# Patient Record
Sex: Female | Born: 1995 | Race: White | Hispanic: No | Marital: Single | Smoking: Current every day smoker
Health system: Southern US, Community
[De-identification: ages and names within clinical notes are randomized; demographics above are authoritative.]

## PROBLEM LIST (undated history)

## (undated) DIAGNOSIS — R011 Cardiac murmur, unspecified: Secondary | ICD-10-CM

## (undated) DIAGNOSIS — R569 Unspecified convulsions: Secondary | ICD-10-CM

## (undated) DIAGNOSIS — F419 Anxiety disorder, unspecified: Secondary | ICD-10-CM

## (undated) DIAGNOSIS — E669 Obesity, unspecified: Secondary | ICD-10-CM

## (undated) HISTORY — DX: Anxiety disorder, unspecified: F41.9

## (undated) HISTORY — PX: TONSILLECTOMY: SUR1361

---
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
## 2017-01-02 ENCOUNTER — Encounter: Payer: Self-pay | Admitting: Emergency Medicine

## 2017-01-02 ENCOUNTER — Emergency Department
Admission: EM | Admit: 2017-01-02 | Discharge: 2017-01-02 | Disposition: A | Discharge status: Home / Self Care | Payer: Medicaid Other | Attending: Emergency Medicine | Admitting: Emergency Medicine

## 2017-01-02 ENCOUNTER — Emergency Department: Payer: Medicaid Other

## 2017-01-02 DIAGNOSIS — R69 Illness, unspecified: Secondary | ICD-10-CM

## 2017-01-02 DIAGNOSIS — R51 Headache: Secondary | ICD-10-CM | POA: Insufficient documentation

## 2017-01-02 DIAGNOSIS — Z7722 Contact with and (suspected) exposure to environmental tobacco smoke (acute) (chronic): Secondary | ICD-10-CM | POA: Insufficient documentation

## 2017-01-02 DIAGNOSIS — R05 Cough: Secondary | ICD-10-CM | POA: Insufficient documentation

## 2017-01-02 DIAGNOSIS — H9203 Otalgia, bilateral: Secondary | ICD-10-CM | POA: Insufficient documentation

## 2017-01-02 DIAGNOSIS — R0981 Nasal congestion: Secondary | ICD-10-CM | POA: Diagnosis not present

## 2017-01-02 DIAGNOSIS — R110 Nausea: Secondary | ICD-10-CM | POA: Insufficient documentation

## 2017-01-02 DIAGNOSIS — J111 Influenza due to unidentified influenza virus with other respiratory manifestations: Secondary | ICD-10-CM

## 2017-01-02 DIAGNOSIS — J029 Acute pharyngitis, unspecified: Secondary | ICD-10-CM | POA: Diagnosis present

## 2017-01-02 MED ORDER — ONDANSETRON HCL 4 MG/2ML IJ SOLN: 4.0000 mg | Freq: Once | INTRAMUSCULAR | Status: DC

## 2017-01-02 MED ORDER — ONDANSETRON HCL 4 MG PO TABS: 4.0000 mg | ORAL_TABLET | Freq: Three times a day (TID) | ORAL | 1 refills | Status: AC | PRN

## 2017-01-02 MED ORDER — ONDANSETRON 4 MG PO TBDP
4.0000 mg | ORAL_TABLET | Freq: Once | ORAL | Status: AC
  Administered 2017-01-02: 4 mg via ORAL

## 2017-01-02 MED ORDER — ONDANSETRON 4 MG PO TBDP
ORAL_TABLET | ORAL | Status: AC
Start: 2017-01-02 — End: 2017-01-02
  Administered 2017-01-02: 4 mg via ORAL
  Filled 2017-01-02: qty 1

## 2017-01-02 NOTE — ED Notes (Signed)
Pt. Going home with mother. 

## 2017-01-02 NOTE — ED Notes (Signed)
Pt returned from xray

## 2017-01-02 NOTE — ED Notes (Signed)
Patient transported to X-ray via wheelchair with Link SnufferEddie, radiology tech

## 2017-01-02 NOTE — ED Notes (Signed)
Pt needing to use the bathroom before triaged

## 2017-01-02 NOTE — ED Provider Notes (Signed)
[facility] Emergency Department Provider Note  ____________________________________________  Time seen: Approximately 10:06 PM  I have reviewed the triage vital signs and the nursing notes.   HISTORY  Chief Complaint Sore Throat    HPI Abigail Griffin is a 21 y.o. female presenting to the emergency department with headache, congestion, nonproductive cough, pharyngitis bilateral otalgia, myalgias and fatigue for the past 4 days. Patient denies fever but she has not taken her temperature. No chills. No major changes in bowel or bladder habits. No diarrhea or emesis. Patient denies associated chest pain, chest tightness, shortness of breath, nausea, vomiting or abdominal pain. No alleviating measures have been undertaken.   Past Medical History:  Diagnosis Date  . Anxiety   . Heart murmur    as a baby  . Obesity   . Seizures (HCC)    as a child    Patient Active Problem List   Diagnosis Date Noted  . Abdominal pain in female patient 11/23/2016  . Leukocytosis 06/02/2016    Past Surgical History:  Procedure Laterality Date  . TONSILLECTOMY    . TONSILLECTOMY AND ADENOIDECTOMY Bilateral 02/10/2015   Procedure: TONSILLECTOMY ;  Surgeon: Vernie MurdersPaul Juengel, MD;  Location: [facility] ORS;  Service: ENT;  Laterality: Bilateral;    Prior to Admission medications   Medication Sig Start Date End Date Taking? Authorizing Provider  albuterol (PROVENTIL HFA;VENTOLIN HFA) 108 (90 Base) MCG/ACT inhaler Inhale 2 puffs into the lungs every 6 (six) hours as needed for wheezing or shortness of breath. 09/07/16  Yes Willy Eddyobinson, Patrick, MD  promethazine (PHENERGAN) 12.5 MG tablet Take 1 tablet (12.5 mg total) by mouth every 6 (six) hours as needed for nausea or vomiting. 09/07/16  Yes Willy Eddyobinson, Patrick, MD  traMADol (ULTRAM) 50 MG tablet Take 1 tablet (50 mg total) by mouth every 12 (twelve) hours as needed. 12/28/16 01/02/17 Yes Pia MauWoods, Benjamin Casanas M, PA-C    Allergies Citalopram; Amoxicillin;  and Other  Family History  Problem Relation Age of Onset  . Crohn's disease Mother   . Diabetes Father     Social History Social History  Substance Use Topics  . Smoking status: Passive Smoke Exposure - Never Smoker  . Smokeless tobacco: Never Used  . Alcohol use No     Review of Systems  Constitutional: Patient has had fever.  Eyes: No visual changes. No discharge ENT: Patient has had congestion.  Cardiovascular: no chest pain. Respiratory: Patient has had non-productive cough.  No SOB. Gastrointestinal: Patient has had nausea.  Genitourinary: Negative for dysuria. No hematuria Musculoskeletal: Patient has had myalgias. Skin: Negative for rash, abrasions, lacerations, ecchymosis. Neurological: Negative for headaches, focal weakness or numbness.  ____________________________________________   PHYSICAL EXAM:  VITAL SIGNS: ED Triage Vitals  Enc Vitals Group     BP 01/02/17 2141 (!) 145/95     Pulse Rate 01/02/17 2141 (!) 115     Resp 01/02/17 2141 (!) 24     Temp 01/02/17 2141 98.2 F (36.8 C)     Temp Source 01/02/17 2141 Oral     SpO2 01/02/17 2141 97 %     Weight 01/02/17 2142 300 lb (136.1 kg)     Height 01/02/17 2142 5\' 5"  (1.651 m)     Head Circumference --      Peak Flow --      Pain Score 01/02/17 2141 9     Pain Loc --      Pain Edu? --      Excl. in GC? --  Constitutional: Alert and oriented. Patient is lying supine in bed.  Eyes: Conjunctivae are normal. PERRL. EOMI. Head: Atraumatic. ENT:      Ears: Tympanic membranes are injected bilaterally without evidence of effusion or purulent exudate. Bony landmarks are visualized bilaterally. No pain with palpation at the tragus.      Nose: Nasal turbinates are edematous and erythematous. Copious rhinorrhea visualized.      Mouth/Throat: Mucous membranes are moist. Posterior pharynx is mildly erythematous. No tonsillar hypertrophy or purulent exudate. Uvula is midline. Neck: Full range of motion. No  pain is elicited with flexion at the neck. Hematological/Lymphatic/Immunilogical: No cervical lymphadenopathy. Cardiovascular: Normal rate, regular rhythm. Normal S1 and S2.  Good peripheral circulation. Respiratory: Normal respiratory effort without tachypnea or retractions. Lungs CTAB. Good air entry to the bases with no decreased or absent breath sounds. Gastrointestinal: Bowel sounds 4 quadrants. Soft and nontender to palpation. No guarding or rigidity. No palpable masses. No distention. No CVA tenderness.  Skin:  Skin is warm, dry and intact. No rash noted. Psychiatric: Mood and affect are normal. Speech and behavior are normal. Patient exhibits appropriate insight and judgement.  ____________________________________________   LABS (all labs ordered are listed, but only abnormal results are displayed)  Labs Reviewed - No data to display ____________________________________________  EKG   ____________________________________________  RADIOLOGY Geraldo Pitter, personally viewed and evaluated these images (plain radiographs) as part of my medical decision making, as well as reviewing the written report by the radiologist.  DG chest: No consolidations or findings consistent with pneumonia  No results found.  ____________________________________________    PROCEDURES  Procedure(s) performed:    Procedures    Medications - No data to display   ____________________________________________   INITIAL IMPRESSION / ASSESSMENT AND PLAN / ED COURSE  Pertinent labs & imaging results that were available during my care of the patient were reviewed by me and considered in my medical decision making (see chart for details).  Review of the [place] CSRS was performed in accordance of the [facility] prior to dispensing any controlled drugs.    Assessment and plan: Influenza-like illness Patient presents to the emergency department with headache, congestion, nonproductive cough,  pharyngitis bilateral otalgia, myalgias and fatigue for the past 4 days. DG chest revealed no consolidations or findings consistent with pneumonia. Symptoms are consistent with an influenza-like illness. Patient declines Tamiflu at this time. Rest and hydration were encouraged. Patient was advised to use Tylenol as needed for fever. Patient was advised to follow-up with her primary care provider in one week. All patient questions were answered. ____________________________________________  FINAL CLINICAL IMPRESSION(S) / ED DIAGNOSES  Final diagnoses:  None      NEW MEDICATIONS STARTED DURING THIS VISIT:  New Prescriptions   No medications on file        This chart was dictated using voice recognition software/Dragon. Despite best efforts to proofread, errors can occur which can change the meaning. Any change was purely unintentional.    Orvil Feil, PA-C 01/03/17 1609    Myrna Blazer, MD 01/06/17 [phone]

## 2017-01-02 NOTE — ED Triage Notes (Signed)
Pt states that her throat is hurting and is congested

## 2017-01-06 ENCOUNTER — Ambulatory Visit: Payer: Medicaid Other | Admitting: Psychiatry

## 2017-01-13 ENCOUNTER — Ambulatory Visit
Admission: EM | Admit: 2017-01-13 | Discharge: 2017-01-13 | Disposition: A | Discharge status: Home / Self Care | Payer: Medicaid Other | Attending: Family Medicine | Admitting: Family Medicine

## 2017-01-13 DIAGNOSIS — R059 Cough, unspecified: Secondary | ICD-10-CM

## 2017-01-13 DIAGNOSIS — R05 Cough: Secondary | ICD-10-CM | POA: Diagnosis not present

## 2017-01-13 DIAGNOSIS — W57XXXA Bitten or stung by nonvenomous insect and other nonvenomous arthropods, initial encounter: Secondary | ICD-10-CM

## 2017-01-13 DIAGNOSIS — J0110 Acute frontal sinusitis, unspecified: Secondary | ICD-10-CM | POA: Diagnosis not present

## 2017-01-13 DIAGNOSIS — J069 Acute upper respiratory infection, unspecified: Secondary | ICD-10-CM

## 2017-01-13 DIAGNOSIS — J321 Chronic frontal sinusitis: Secondary | ICD-10-CM

## 2017-01-13 MED ORDER — AZITHROMYCIN 250 MG PO TABS: 250.0000 mg | ORAL_TABLET | Freq: Every day | ORAL | 0 refills | Status: AC

## 2017-01-13 MED ORDER — GUAIFENESIN-DM 100-10 MG/5ML PO SYRP: 5.0000 mL | ORAL_SOLUTION | ORAL | 0 refills | Status: DC | PRN

## 2017-01-13 NOTE — Discharge Instructions (Signed)
-  azithromycin: Take 2 tablets by mouth the first day followed by one tablet daily for next 4 days. -Robitussin DM as needed for cough -can continue albuterol as needed for wheezing -drink plenty of fluids -continue Flonase with spray directed slightly towards ears -can use OTC nasal saline spray to help clear sinus congestion

## 2017-01-13 NOTE — ED Provider Notes (Signed)
CSN: [id number]     Arrival date & time 01/13/17  [id number] History   First MD Initiated Contact with Patient 01/13/17 1902     Chief Complaint  Patient presents with  . Nasal Congestion  . Insect Bite   (Consider location/radiation/quality/duration/timing/severity/associated sxs/prior Treatment) Patient is an 21 year old female who presents with complaint of sore throat, stuffy nose, and cold-like symptoms for approximately 3 weeks. Patient was seen in the ER at [facility] on [facility] and diagnosed with flulike illness and given prescription for Phenergan for nausea and guaifenesin with dextromethorphan for cough. Patient was seen by primary care 2 days later for same symptoms. She was diagnosed with bronchospasms and given a prescription for prednisone 40 mg daily 7 days. Patient sees completed the prednisone but still continues to have a cough. She does report some drainage in her throat which does cause her cough as well as her sore throat. Patient reports coughing up yellow sputum. Patient denies fever or chills but does report some headaches. She is using Flonase nasal spray to help with her symptoms. Patient also reporting some ear issues as well. Patient denies shortness of breath or chest pain. She denies smoking and alcohol use as well.  Patient also complains of some insect bites the back of her legs that she has had for about one week. Patient is using Benadryl to help with her symptoms of itching.      Past Medical History:  Diagnosis Date  . Anxiety   . Heart murmur    as a baby  . Obesity   . Seizures (HCC)    as a child   Past Surgical History:  Procedure Laterality Date  . TONSILLECTOMY    . TONSILLECTOMY AND ADENOIDECTOMY Bilateral 02/10/2015   Procedure: TONSILLECTOMY ;  Surgeon: Vernie MurdersPaul Juengel, MD;  Location: [facility] ORS;  Service: ENT;  Laterality: Bilateral;   Family History  Problem Relation Age of Onset  . Crohn's disease Mother   . Diabetes Father    Social  History  Substance Use Topics  . Smoking status: Passive Smoke Exposure - Never Smoker  . Smokeless tobacco: Never Used  . Alcohol use No   OB History    No data available     Review of Systems  Constitutional: Negative for chills and fever.  HENT: Positive for ear pain, postnasal drip, sinus pain, sinus pressure and sore throat.   Eyes: Negative.   Respiratory: Positive for cough (with production of yellow mucus). Negative for chest tightness and wheezing.   Gastrointestinal: Negative.   Genitourinary: Positive for urgency.  Musculoskeletal: Negative.   Skin:       Insect bites the back of her legs, itching  Neurological: Negative.     Allergies  Citalopram; Amoxicillin; and Other  Home Medications   Prior to Admission medications   Medication Sig Start Date End Date Taking? Authorizing Provider  albuterol (PROVENTIL HFA;VENTOLIN HFA) 108 (90 Base) MCG/ACT inhaler Inhale 2 puffs into the lungs every 6 (six) hours as needed for wheezing or shortness of breath. 09/07/16  Yes Willy Eddyobinson, Patrick, MD  promethazine (PHENERGAN) 12.5 MG tablet Take 1 tablet (12.5 mg total) by mouth every 6 (six) hours as needed for nausea or vomiting. 09/07/16  Yes Willy Eddyobinson, Patrick, MD  azithromycin (ZITHROMAX Z-PAK) 250 MG tablet Take 1 tablet (250 mg total) by mouth daily. Take 2 tablets by mouth the first day followed by one tablet daily for next 4 days. 01/13/17 01/18/17  Candis SchatzHarris, Clarice Bonaventure D, PA-C  guaiFENesin-dextromethorphan (  ROBITUSSIN DM) 100-10 MG/5ML syrup Take 5 mLs by mouth every 4 (four) hours as needed for cough. 01/13/17   Candis Schatz, PA-C   Meds Ordered and Administered this Visit  Medications - No data to display  BP 109/88 (BP Location: Left Arm)   Pulse (!) 106   Temp 98.3 F (36.8 C) (Oral)   Resp 18   Ht 5\' 3"  (1.6 m)   Wt 300 lb (136.1 kg)   SpO2 99%   BMI 53.14 kg/m  No data found.   Physical Exam  Constitutional: She is oriented to person, place, and time. She  appears well-developed. No distress.  HENT:  Head: Normocephalic and atraumatic.  Right Ear: Tympanic membrane and ear canal normal. No middle ear effusion.  Left Ear: Ear canal normal. Tympanic membrane is injected.  No middle ear effusion.  Nose: Right sinus exhibits frontal sinus tenderness. Right sinus exhibits no maxillary sinus tenderness. Left sinus exhibits frontal sinus tenderness. Left sinus exhibits no maxillary sinus tenderness.  Mouth/Throat: Posterior oropharyngeal erythema present.  Clear post nasal drainage  Cardiovascular: Normal rate, regular rhythm and normal heart sounds.   Pulmonary/Chest: Effort normal and breath sounds normal. She has no wheezes. She has no rales.  Abdominal: Soft.  Musculoskeletal: Normal range of motion.  Neurological: She is alert and oriented to person, place, and time.  Skin: Skin is warm and dry.  Several insect bites to the back or legs, flat. No evidence of infection or drainage.    Urgent Care Course     Procedures   Labs Review Labs Reviewed - No data to display  Imaging Review No results found.   MDM   1. Upper respiratory tract infection, unspecified type   2. Frontal sinusitis, unspecified chronicity   3. Cough    New Prescriptions   AZITHROMYCIN (ZITHROMAX Z-PAK) 250 MG TABLET    Take 1 tablet (250 mg total) by mouth daily. Take 2 tablets by mouth the first day followed by one tablet daily for next 4 days.   GUAIFENESIN-DEXTROMETHORPHAN (ROBITUSSIN DM) 100-10 MG/5ML SYRUP    Take 5 mLs by mouth every 4 (four) hours as needed for cough.   Patient continues to have cough and upper respiratory symptoms refractory to visits to the emergency room and to her primary care provider. Patient completed a seven-day course of high-dose prednisone with no improvement in her cough however she does not have any wheezing at this point. Patient with sinus pressure and pain with palpation along with cervical node tenderness. Give patient a  prescription for a Z-Pak as well as Robitussin-DM for her cough. Patient is to follow with her PCP or the clinic should her symptoms worsen or not improve. Patient advised she could use over-the-counter hydrocortisone cream and/or topical Benadryl cream/gel for her insect bite itching. Patient vocalized understanding is in agreement with this plan.  Candis Schatz, PA-C      Candis Schatz, PA-C 01/13/17 Barry Brunner

## 2017-01-13 NOTE — ED Triage Notes (Signed)
Patient complains of cough, congestion x 3 weeks. Patient states that she recently finished Prednisone. Patient states that she had got some better but has worsened again. Patient reports throat pain. Patient also reports bite marks on her legs that she attributes to mosquitos.

## 2017-01-26 ENCOUNTER — Ambulatory Visit: Payer: Medicaid Other | Admitting: Licensed Clinical Social Worker

## 2017-01-26 ENCOUNTER — Ambulatory Visit: Payer: Medicaid Other | Admitting: Psychiatry

## 2018-03-18 IMAGING — CR DG CHEST 2V
1 series · 2 of 2 positions shown · non-contrast
Comparison: Chest radiograph April 22, 2014

CLINICAL DATA: Productive cough, low-grade fever and chills.

EXAM:
CHEST  2 VIEW

[Series 1: dg chest 2 view · 0.14mm/px · 2 of 2 slices shown]
[im 1/2]
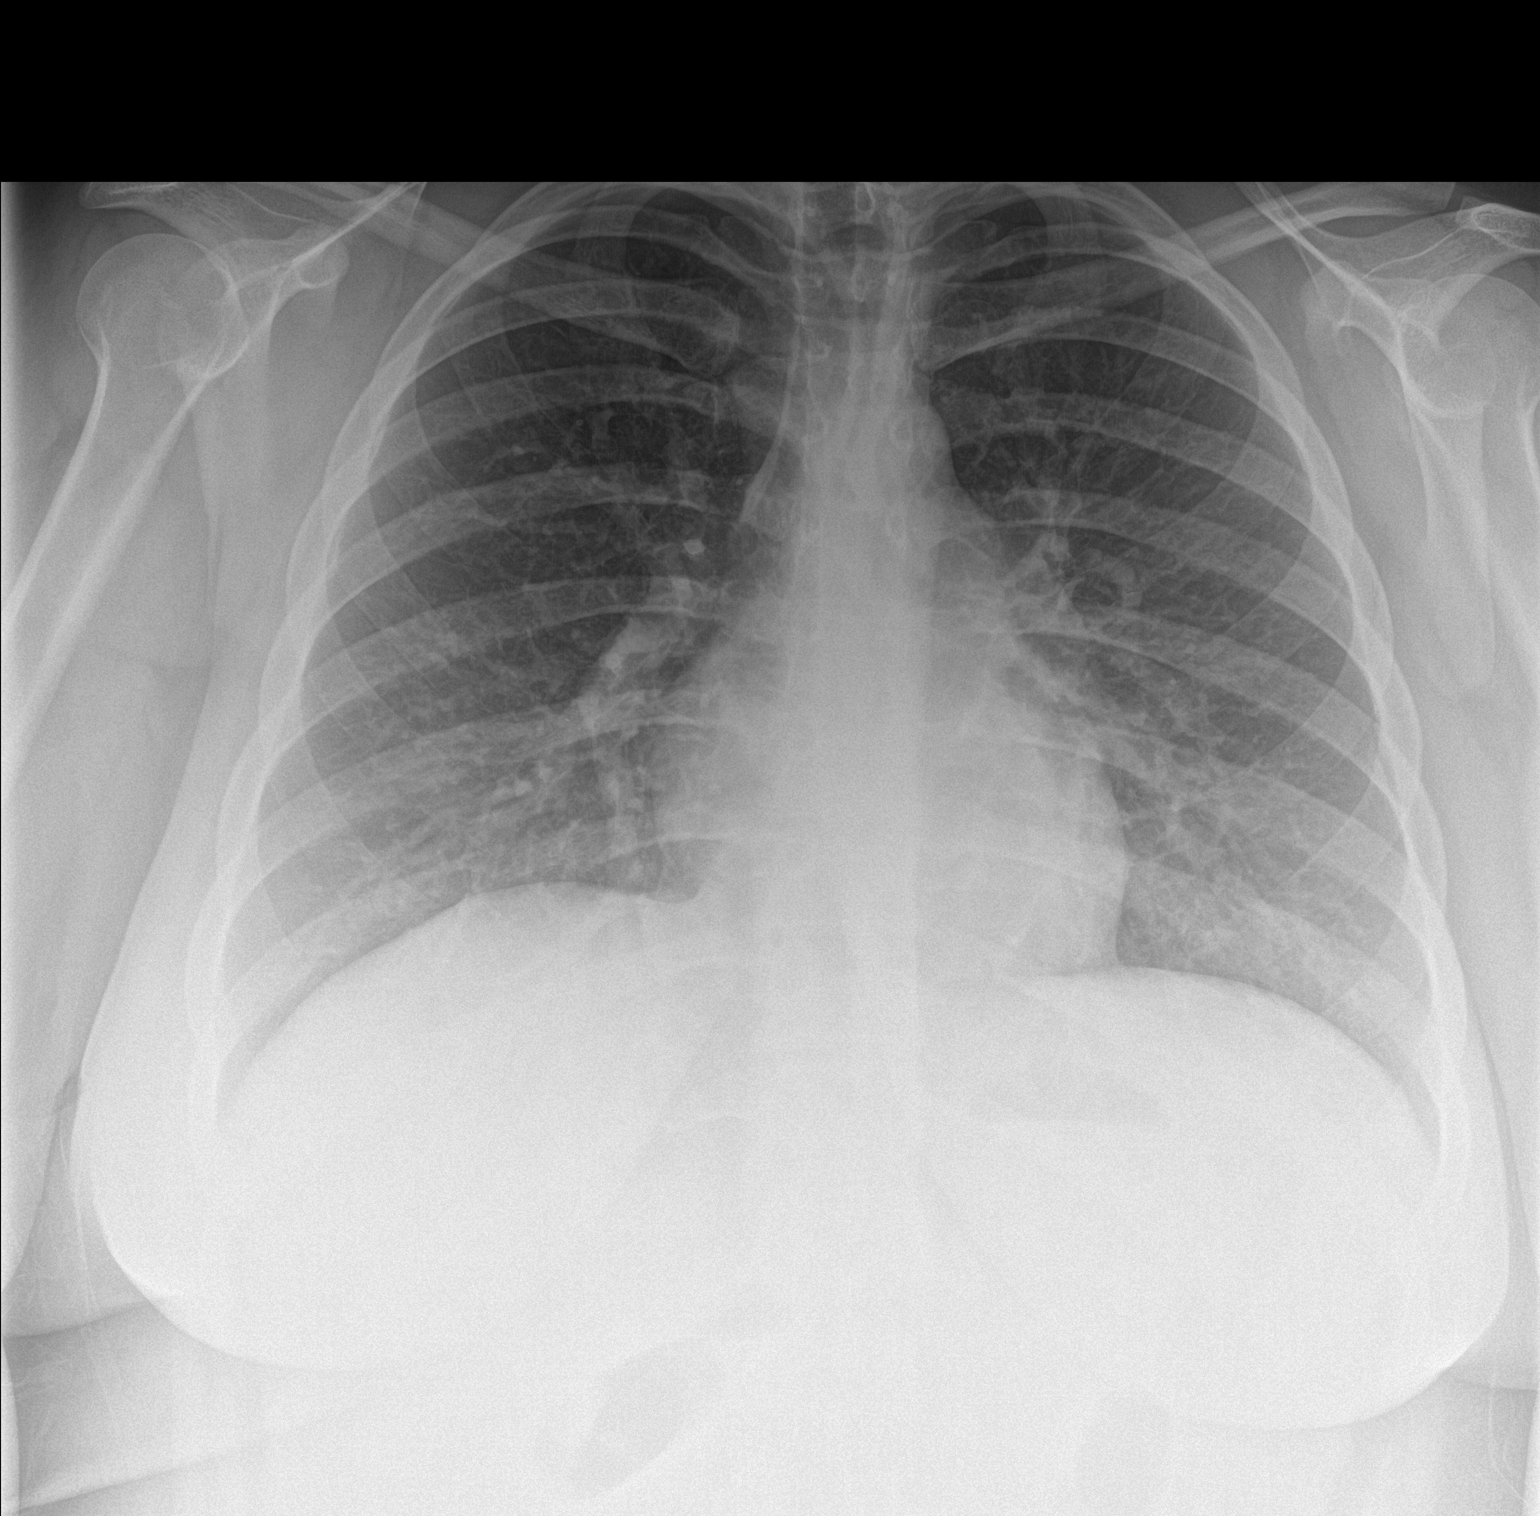
[im 2/2]
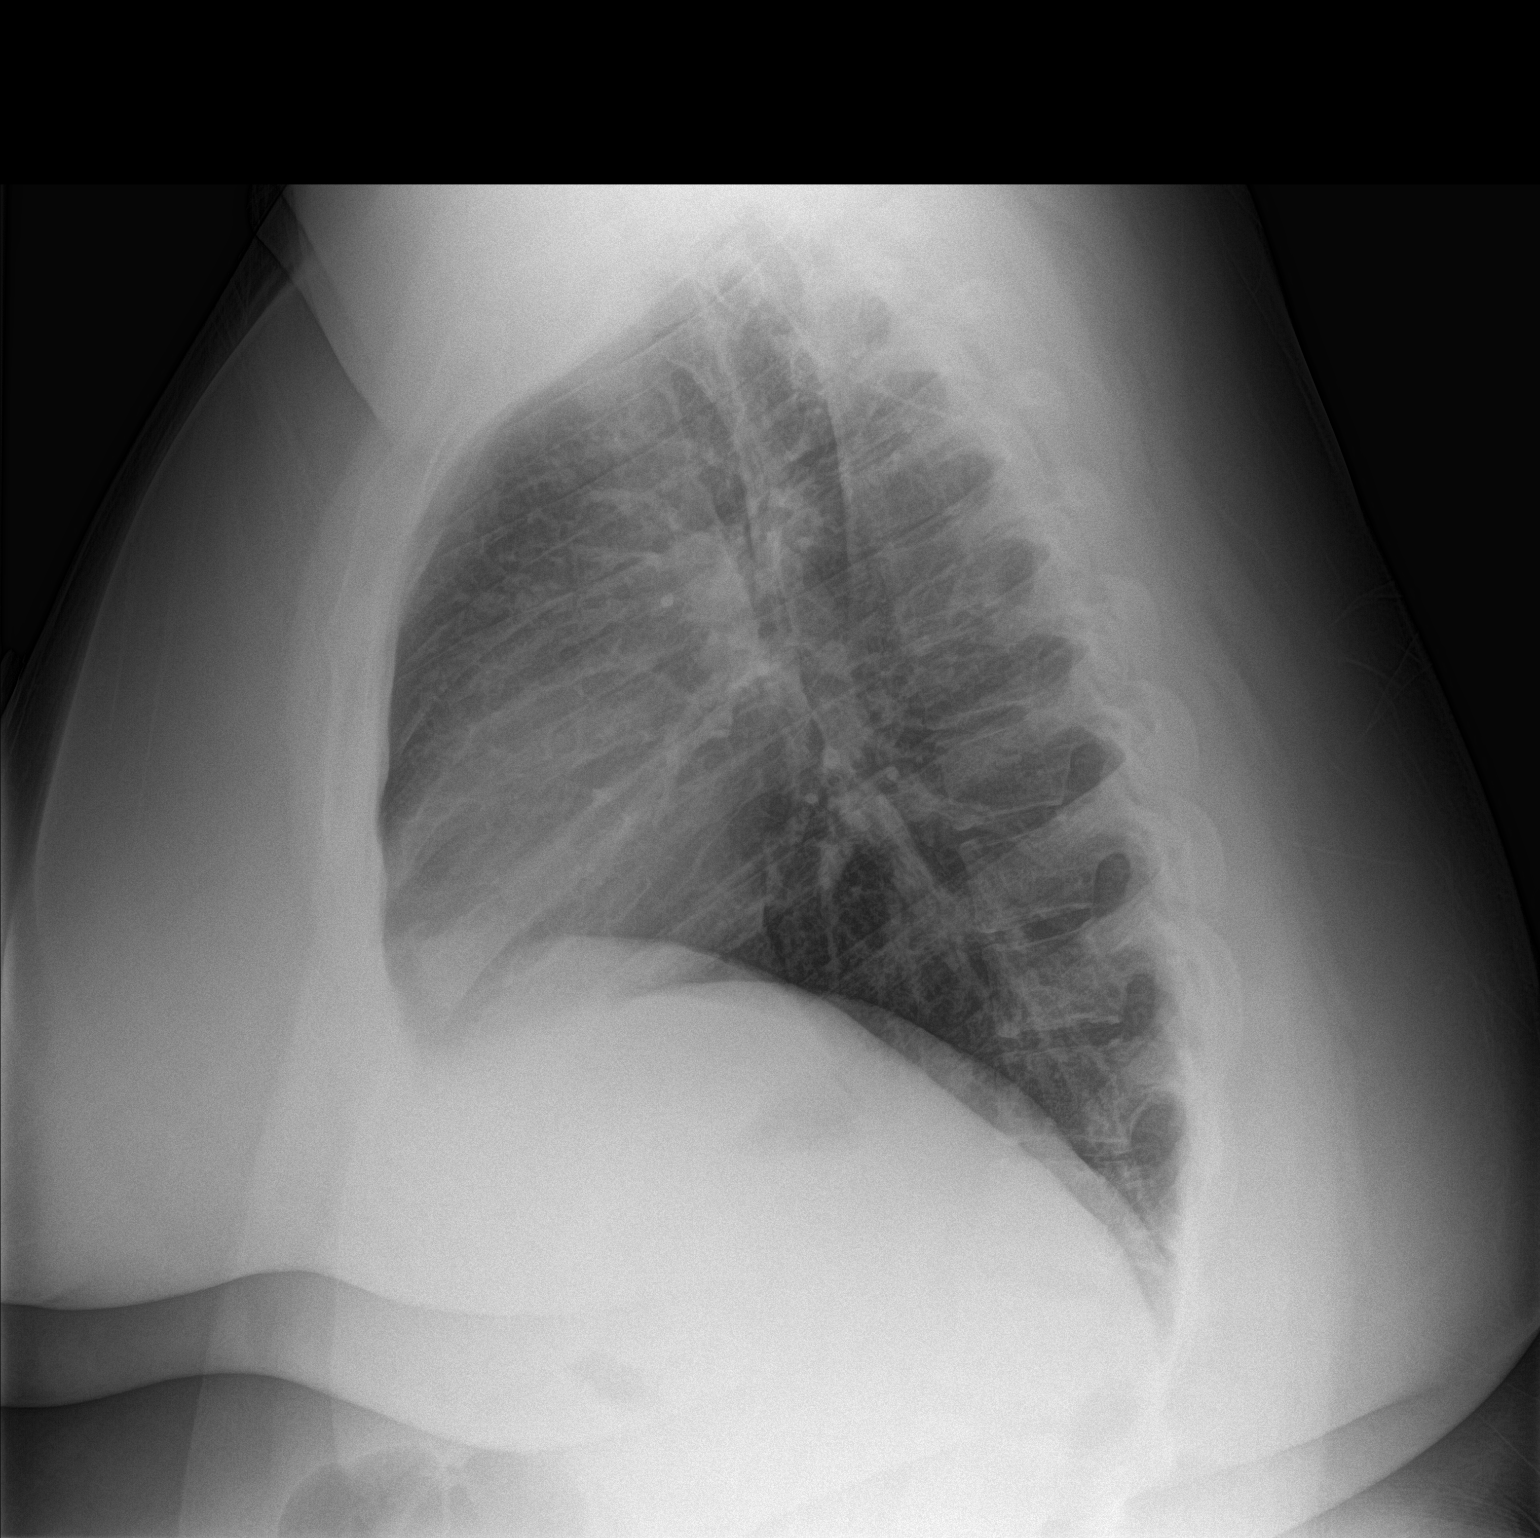

[2 of 2 positions shown; findings below may reference images not displayed]

FINDINGS: Cardiomediastinal silhouette is normal. No pleural effusions or
focal consolidations. Trachea projects midline and there is no
pneumothorax. Soft tissue planes and included osseous structures are
non-suspicious. Large body habitus.
IMPRESSION: Normal chest.

## 2018-03-20 IMAGING — CR DG CHEST 2V
2 series · 2 of 2 positions shown · non-contrast
Comparison: 08/25/2016 and earlier.

CLINICAL DATA: 20-year-old female with a dark brown vomiting for 5
days. Cough. Initial encounter.

EXAM:
CHEST  2 VIEW

[chest pa]
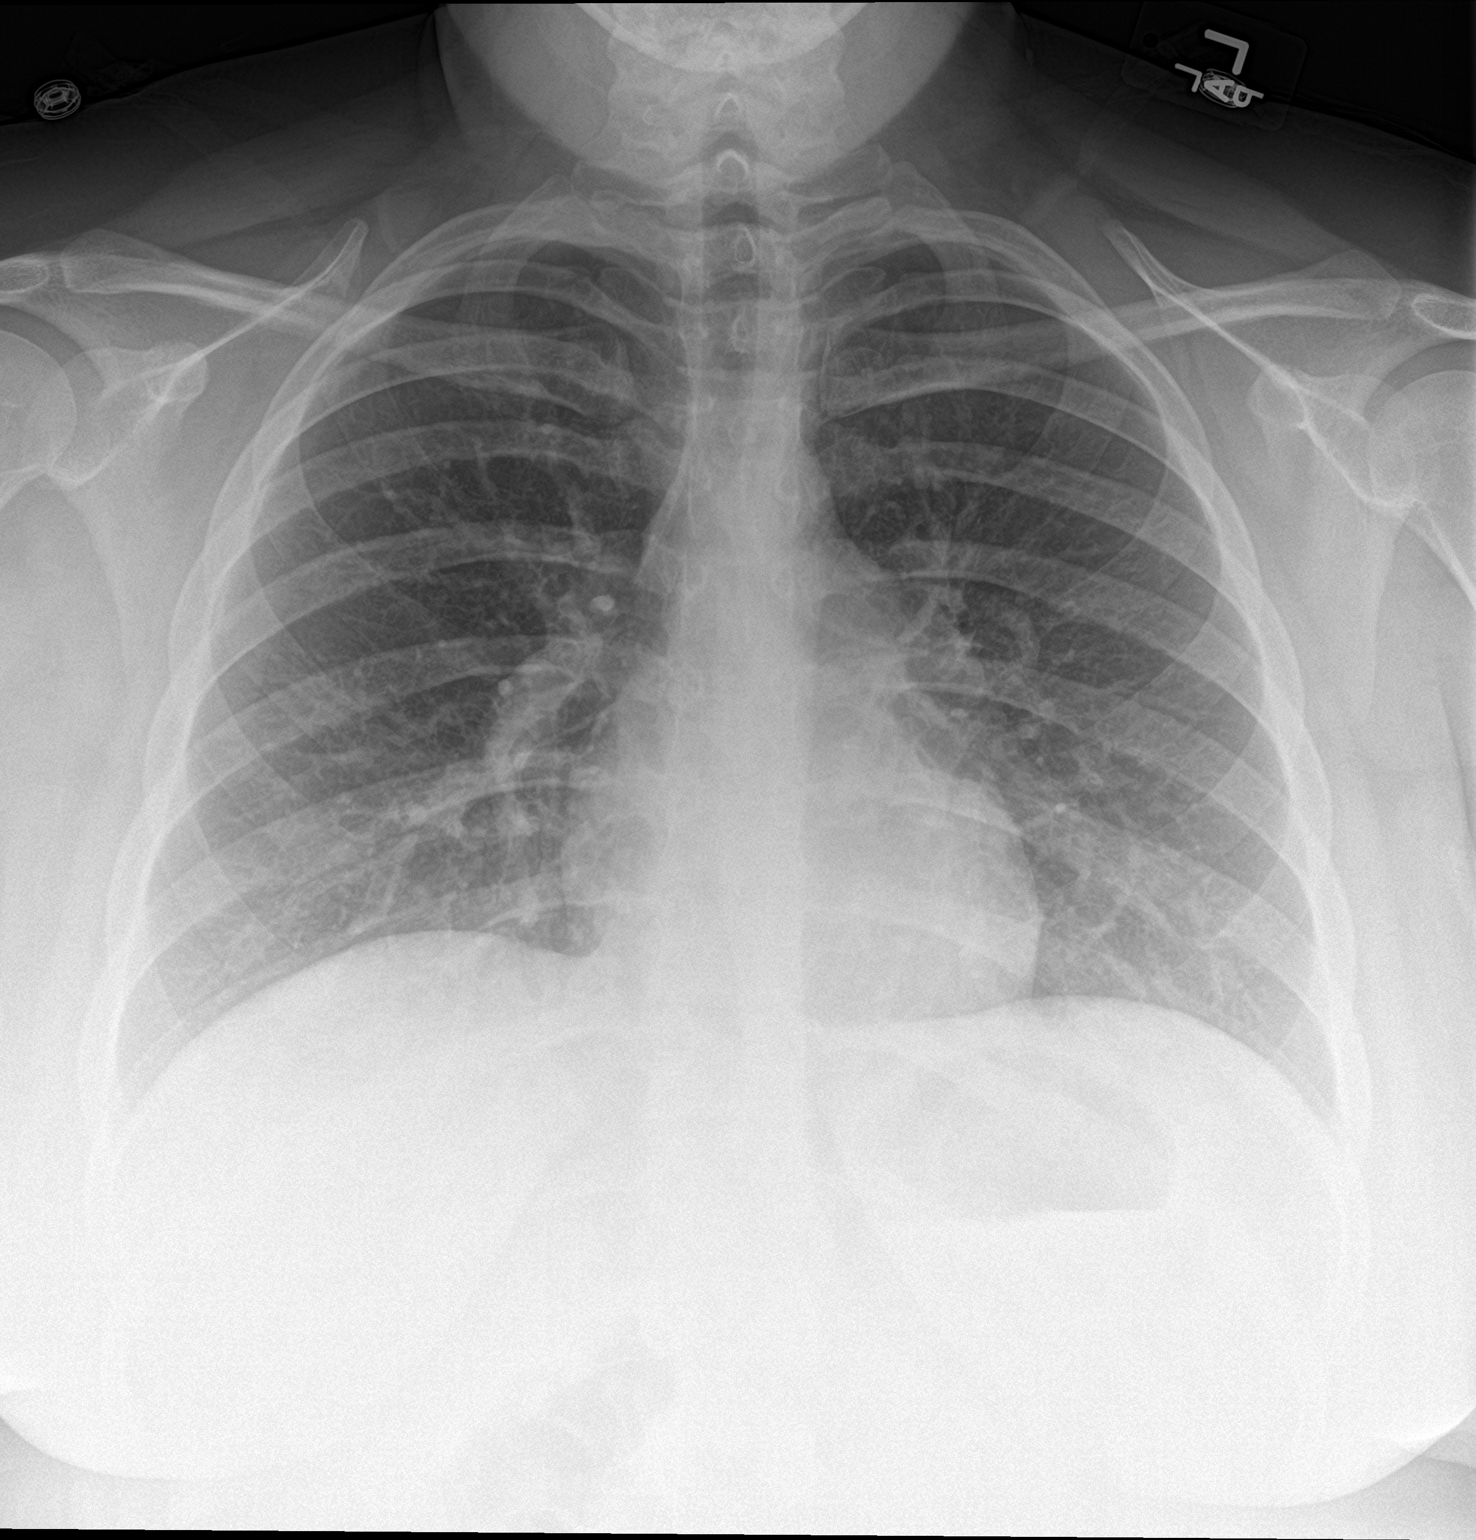

[chest lat]
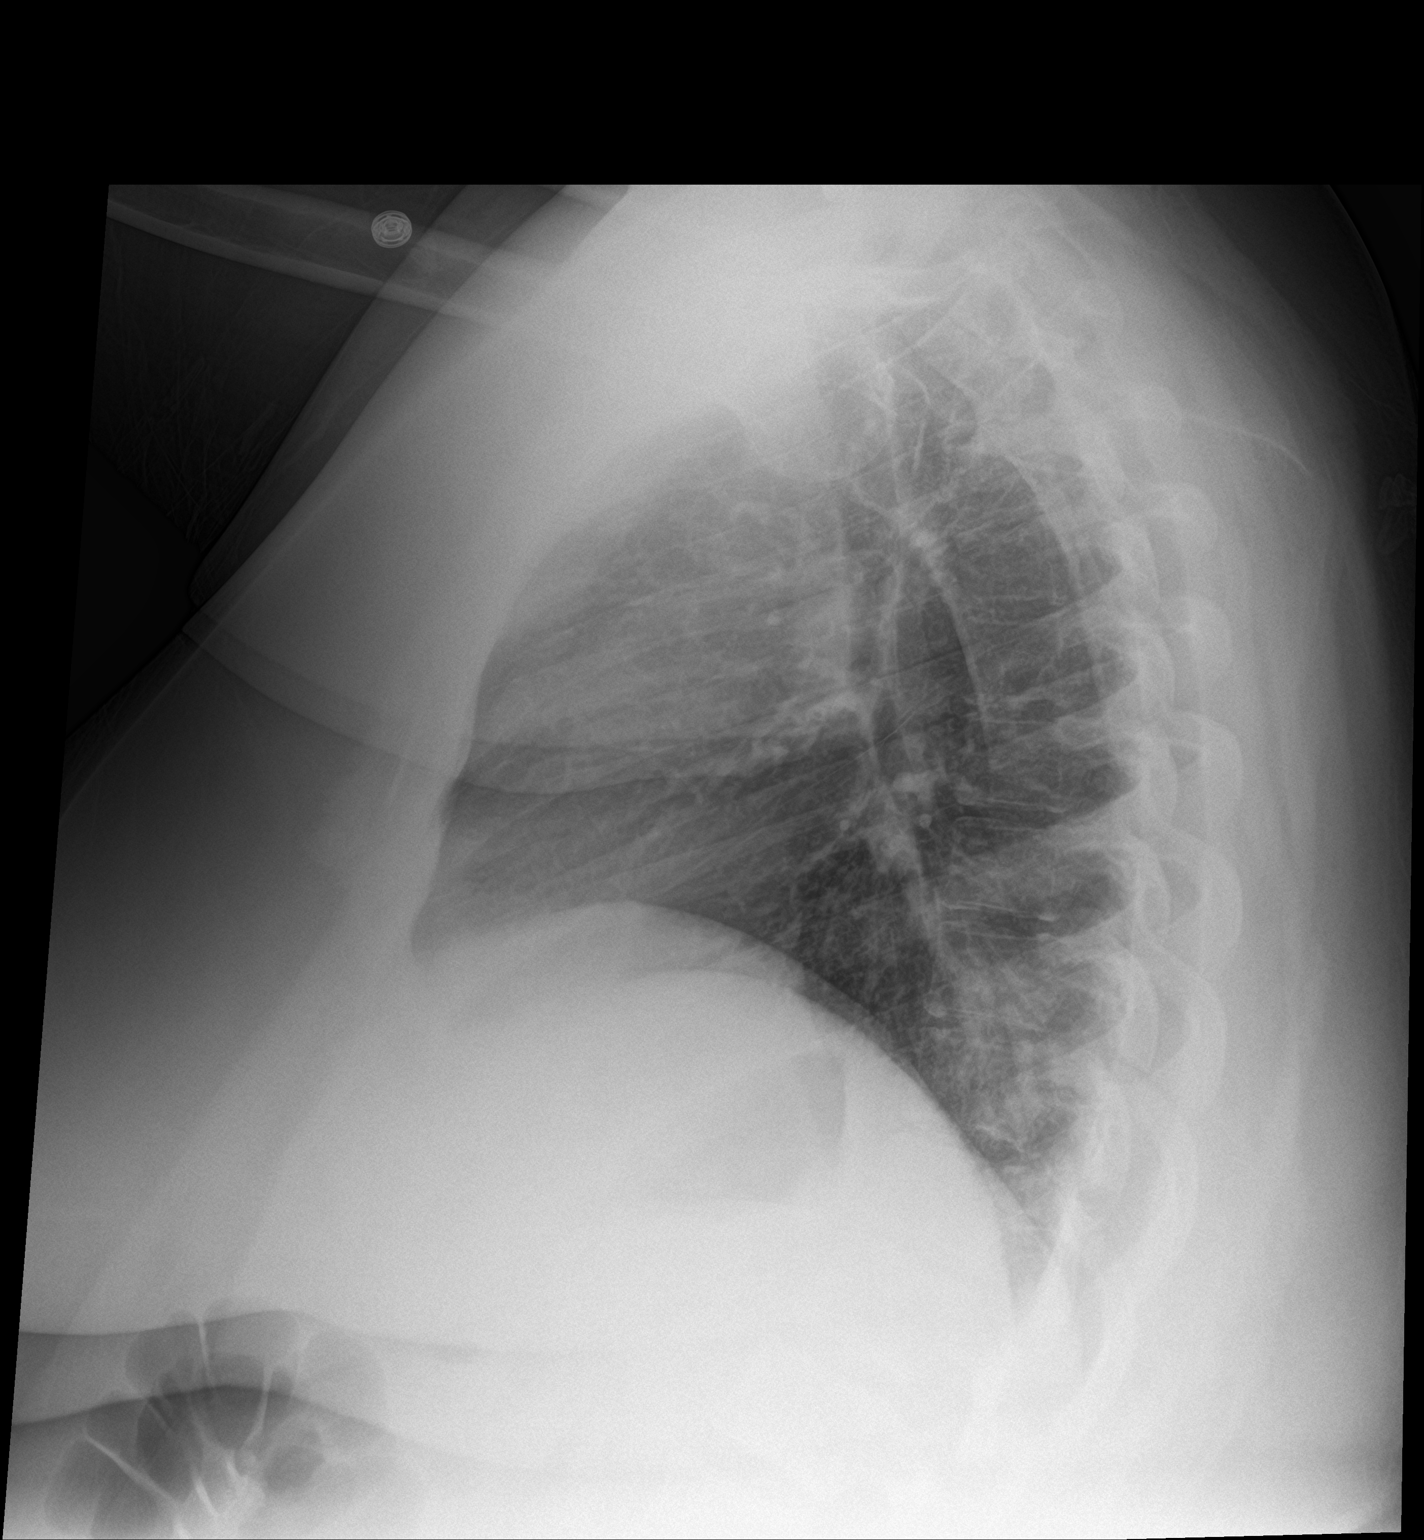

[2 of 2 positions shown; findings below may reference images not displayed]

FINDINGS: Large body habitus. Lung volumes remain normal. Mediastinal contours
remain normal. Visualized tracheal air column is within normal
limits. No pneumothorax, pulmonary edema, pleural effusion or
consolidation. Mildly increased interstitial markings are stable.
Negative visible bowel gas pattern. No pneumoperitoneum. No acute
osseous abnormality identified.
IMPRESSION: Stable with no acute cardiopulmonary abnormality.

## 2018-07-16 IMAGING — NM NM HEPATO W/GB/PHARM/[PERSON_NAME]
2 series · 12 of 12 positions shown · non-contrast
Comparison: Ultrasound of the abdomen of 10/20/2016

CLINICAL DATA: Dyspepsia, nausea, vomiting over the last 6 months

EXAM:
NUCLEAR MEDICINE HEPATOBILIARY IMAGING WITH GALLBLADDER EF
TECHNIQUE: Sequential images of the abdomen were obtained [DATE] minutes
following intravenous administration of radiopharmaceutical. After
oral ingestion of Ensure, gallbladder ejection fraction was
determined. At 60 min, normal ejection fraction is greater than 33%.
RADIOPHARMACEUTICALS:  5.0 mCi 6c-HHm  Choletec IV

[Series 1000: gallbladder ef · 4.80mm/px · 6 of 120 frames shown]
[frame 11/120]
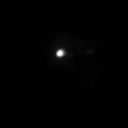
[frame 31/120]
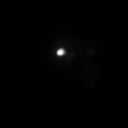
[frame 51/120]
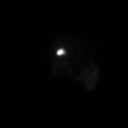
[frame 71/120]
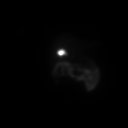
[frame 91/120]
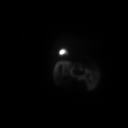
[frame 111/120]
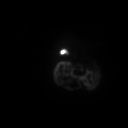

[Series 1000: hepatobiliary scan · 9.59mm/px · 6 of 60 frames shown]
[frame 6/60]
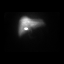
[frame 16/60]
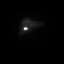
[frame 26/60]
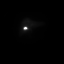
[frame 36/60]
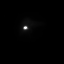
[frame 46/60]
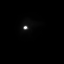
[frame 56/60]
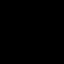

[12 of 12 positions shown; findings below may reference images not displayed]

FINDINGS: Prompt uptake and biliary excretion of activity by the liver is
seen. Gallbladder activity is visualized, consistent with patency of
cystic duct. Biliary activity passes into small bowel, consistent
with patent common bile duct.

Calculated gallbladder ejection fraction is 69%. (Normal gallbladder
ejection fraction with Ensure is greater than 33%.)
IMPRESSION: 1. Normal nuclear medicine hepatobiliary scan.
2. Normal gallbladder ejection fraction of 69%.

## 2019-09-08 ENCOUNTER — Emergency Department (HOSPITAL_COMMUNITY)
Admission: EM | Admit: 2019-09-08 | Discharge: 2019-09-08 | Disposition: A | Discharge status: Home / Self Care | Payer: Medicaid Other | Attending: Emergency Medicine | Admitting: Emergency Medicine

## 2019-09-08 ENCOUNTER — Encounter (HOSPITAL_COMMUNITY): Payer: Self-pay | Admitting: *Deleted

## 2019-09-08 ENCOUNTER — Other Ambulatory Visit: Payer: Self-pay

## 2019-09-08 DIAGNOSIS — R0981 Nasal congestion: Secondary | ICD-10-CM | POA: Diagnosis present

## 2019-09-08 DIAGNOSIS — Z7722 Contact with and (suspected) exposure to environmental tobacco smoke (acute) (chronic): Secondary | ICD-10-CM | POA: Insufficient documentation

## 2019-09-08 MED ORDER — LORATADINE 10 MG PO TABS: 10.0000 mg | ORAL_TABLET | Freq: Every day | ORAL | 0 refills | Status: AC | PRN

## 2019-09-08 NOTE — ED Triage Notes (Signed)
Pt with congestion for 3-4 weeks and is over [redacted] weeks pregnant.  Seen OB/GYN and was given flonase, pt states no relief.

## 2019-09-08 NOTE — ED Provider Notes (Signed)
[facility] EMERGENCY DEPARTMENT Provider Note   CSN: [id number] Arrival date & time: 09/08/19  2054     History Chief Complaint  Patient presents with  . Nasal Congestion    Abigail Griffin is a 24 y.o. female.  Patient complains of nasal congestion and sore throat.  She states this is her allergies.  She wants some Claritin  The history is provided by the patient. No language interpreter was used.  URI Presenting symptoms: congestion   Presenting symptoms: no cough and no fatigue   Severity:  Mild Onset quality:  Gradual Timing:  Constant Progression:  Worsening Chronicity:  Recurrent Relieved by:  Nothing Worsened by:  Nothing Ineffective treatments:  None tried Associated symptoms: no headaches        Past Medical History:  Diagnosis Date  . Anxiety   . Heart murmur    as a baby  . Obesity   . Seizures (HCC)    as a child    Patient Active Problem List   Diagnosis Date Noted  . Abdominal pain in female patient 11/23/2016  . Leukocytosis 06/02/2016    Past Surgical History:  Procedure Laterality Date  . TONSILLECTOMY    . TONSILLECTOMY AND ADENOIDECTOMY Bilateral 02/10/2015   Procedure: TONSILLECTOMY ;  Surgeon: Vernie Murders, MD;  Location: [facility] ORS;  Service: ENT;  Laterality: Bilateral;     OB History    Gravida  1   Para      Term      Preterm      AB      Living        SAB      TAB      Ectopic      Multiple      Live Births              Family History  Problem Relation Age of Onset  . Crohn's disease Mother   . Diabetes Father     Social History   Tobacco Use  . Smoking status: Passive Smoke Exposure - Never Smoker  . Smokeless tobacco: Never Used  Substance Use Topics  . Alcohol use: No  . Drug use: No    Home Medications Prior to Admission medications   Medication Sig Start Date End Date Taking? Authorizing Provider  albuterol (PROVENTIL HFA;VENTOLIN HFA) 108 (90 Base) MCG/ACT inhaler Inhale 2 puffs into  the lungs every 6 (six) hours as needed for wheezing or shortness of breath. 09/07/16   Willy Eddy, MD  guaiFENesin-dextromethorphan (ROBITUSSIN DM) 100-10 MG/5ML syrup Take 5 mLs by mouth every 4 (four) hours as needed for cough. 01/13/17   Candis Schatz, PA-C  loratadine (CLARITIN) 10 MG tablet Take 1 tablet (10 mg total) by mouth daily as needed for allergies. One po daily x 5 days 09/08/19   Bethann Berkshire, MD  promethazine (PHENERGAN) 12.5 MG tablet Take 1 tablet (12.5 mg total) by mouth every 6 (six) hours as needed for nausea or vomiting. 09/07/16   Willy Eddy, MD    Allergies    Citalopram, Keflex [cephalexin], Amoxicillin, and Other  Review of Systems   Review of Systems  Constitutional: Negative for appetite change and fatigue.  HENT: Positive for congestion. Negative for ear discharge and sinus pressure.   Eyes: Negative for discharge.  Respiratory: Negative for cough.   Cardiovascular: Negative for chest pain.  Gastrointestinal: Negative for abdominal pain and diarrhea.  Genitourinary: Negative for frequency and hematuria.  Musculoskeletal: Negative for back pain.  Skin: Negative for rash.  Neurological: Negative for seizures and headaches.  Psychiatric/Behavioral: Negative for hallucinations.    Physical Exam Updated Vital Signs BP (!) 151/80 (BP Location: Right Arm)   Pulse (!) 114   Temp 98.2 F (36.8 C) (Oral)   Resp 20   Ht 5\' 3"  (1.6 m)   Wt (!) 167.8 kg   LMP 07/16/2019   SpO2 100%   BMI 65.54 kg/m   Physical Exam Vitals and nursing note reviewed.  Constitutional:      Appearance: She is well-developed.  HENT:     Head: Normocephalic.     Nose: Nose normal.  Eyes:     General: No scleral icterus.    Conjunctiva/sclera: Conjunctivae normal.  Neck:     Thyroid: No thyromegaly.  Cardiovascular:     Rate and Rhythm: Normal rate and regular rhythm.     Heart sounds: No murmur. No friction rub. No gallop.   Pulmonary:     Breath sounds: No  stridor. No wheezing or rales.  Chest:     Chest wall: No tenderness.  Abdominal:     General: There is no distension.     Tenderness: There is no abdominal tenderness. There is no rebound.  Musculoskeletal:        General: Normal range of motion.     Cervical back: Neck supple.  Lymphadenopathy:     Cervical: No cervical adenopathy.  Skin:    Findings: No erythema or rash.  Neurological:     Mental Status: She is oriented to person, place, and time.     Motor: No abnormal muscle tone.     Coordination: Coordination normal.  Psychiatric:        Behavior: Behavior normal.     ED Results / Procedures / Treatments   Labs (all labs ordered are listed, but only abnormal results are displayed) Labs Reviewed - No data to display  EKG None  Radiology No results found.  Procedures Procedures (including critical care time)  Medications Ordered in ED Medications - No data to display  ED Course  I have reviewed the triage vital signs and the nursing notes.  Pertinent labs & imaging results that were available during my care of the patient were reviewed by me and considered in my medical decision making (see chart for details).    MDM Rules/Calculators/A&P                      Patient with mild nasal congestion from allergies.  Patient will be given Claritin Final Clinical Impression(s) / ED Diagnoses Final diagnoses:  Nasal congestion    Rx / DC Orders ED Discharge Orders         Ordered    loratadine (CLARITIN) 10 MG tablet  Daily PRN     09/08/19 2137           Milton Ferguson, MD 09/08/19 2144

## 2019-09-08 NOTE — Discharge Instructions (Addendum)
Follow-up with your OB/GYN doctor as scheduled

## 2019-12-07 ENCOUNTER — Other Ambulatory Visit: Payer: Self-pay

## 2019-12-07 ENCOUNTER — Emergency Department (HOSPITAL_COMMUNITY)
Admission: EM | Admit: 2019-12-07 | Discharge: 2019-12-07 | Disposition: A | Discharge status: Home / Self Care | Payer: Medicaid Other | Attending: Emergency Medicine | Admitting: Emergency Medicine

## 2019-12-07 DIAGNOSIS — J029 Acute pharyngitis, unspecified: Secondary | ICD-10-CM | POA: Diagnosis not present

## 2019-12-07 DIAGNOSIS — R05 Cough: Secondary | ICD-10-CM | POA: Insufficient documentation

## 2019-12-07 DIAGNOSIS — O26892 Other specified pregnancy related conditions, second trimester: Secondary | ICD-10-CM | POA: Diagnosis present

## 2019-12-07 DIAGNOSIS — Z20822 Contact with and (suspected) exposure to covid-19: Secondary | ICD-10-CM | POA: Diagnosis not present

## 2019-12-07 DIAGNOSIS — Z3A20 20 weeks gestation of pregnancy: Secondary | ICD-10-CM | POA: Diagnosis not present

## 2019-12-07 DIAGNOSIS — J069 Acute upper respiratory infection, unspecified: Secondary | ICD-10-CM

## 2019-12-07 DIAGNOSIS — R0981 Nasal congestion: Secondary | ICD-10-CM | POA: Diagnosis not present

## 2019-12-07 LAB — RESPIRATORY PANEL BY RT PCR (FLU A&B, COVID)
Influenza A by PCR: NEGATIVE
Influenza B by PCR: NEGATIVE
SARS Coronavirus 2 by RT PCR: NEGATIVE

## 2019-12-07 NOTE — ED Provider Notes (Addendum)
[facility] Provider Note   CSN: [id number] Arrival date & time: 12/07/19  1858     History Chief Complaint  Patient presents with  . Cough    Abigail Griffin is a 24 y.o. female.  Patient c/o scratchy throat, nasal congestion, fullness in ears, non prod cough, for the past few days. States recent ill contact w family/friend w uri symptoms. No known covid exposure. Denies cp or sob. No trouble breathing or swallowing. No nvd. Patient is [redacted] weeks pregnant, +prenatal care, no abd/pelvic pain or vaginal d/c or bleeding.   The history is provided by the patient.  Cough Associated symptoms: rhinorrhea and sore throat   Associated symptoms: no chest pain, no fever, no headaches, no rash and no shortness of breath        Past Medical History:  Diagnosis Date  . Anxiety   . Heart murmur    as a baby  . Obesity   . Seizures ([place])    as a child    Patient Active Problem List   Diagnosis Date Noted  . Abdominal pain in female patient 11/23/2016  . Leukocytosis 06/02/2016    Past Surgical History:  Procedure Laterality Date  . TONSILLECTOMY    . TONSILLECTOMY AND ADENOIDECTOMY Bilateral 02/10/2015   Procedure: TONSILLECTOMY ;  Surgeon: Margaretha Sheffield, MD;  Location: [facility] ORS;  Service: ENT;  Laterality: Bilateral;     OB History    Gravida  1   Para      Term      Preterm      AB      Living        SAB      TAB      Ectopic      Multiple      Live Births              Family History  Problem Relation Age of Onset  . Crohn's disease Mother   . Diabetes Father     Social History   Tobacco Use  . Smoking status: Passive Smoke Exposure - Never Smoker  . Smokeless tobacco: Never Used  Substance Use Topics  . Alcohol use: No  . Drug use: No    Home Medications Prior to Admission medications   Medication Sig Start Date End Date Taking? Authorizing Provider  albuterol (PROVENTIL HFA;VENTOLIN HFA) 108 (90 Base) MCG/ACT inhaler  Inhale 2 puffs into the lungs every 6 (six) hours as needed for wheezing or shortness of breath. 09/07/16   Merlyn Lot, MD  guaiFENesin-dextromethorphan (ROBITUSSIN DM) 100-10 MG/5ML syrup Take 5 mLs by mouth every 4 (four) hours as needed for cough. 01/13/17   Luvenia Redden, PA-C  loratadine (CLARITIN) 10 MG tablet Take 1 tablet (10 mg total) by mouth daily as needed for allergies. One po daily x 5 days 09/08/19   Milton Ferguson, MD  promethazine (PHENERGAN) 12.5 MG tablet Take 1 tablet (12.5 mg total) by mouth every 6 (six) hours as needed for nausea or vomiting. 09/07/16   Merlyn Lot, MD    Allergies    Citalopram, Keflex [cephalexin], Amoxicillin, and Other  Review of Systems   Review of Systems  Constitutional: Negative for fever.  HENT: Positive for congestion, rhinorrhea and sore throat. Negative for sinus pain.   Eyes: Negative for redness.  Respiratory: Positive for cough. Negative for shortness of breath.   Cardiovascular: Negative for chest pain.  Gastrointestinal: Negative for abdominal pain and vomiting.  Genitourinary:  Negative for flank pain.  Musculoskeletal: Negative for back pain, neck pain and neck stiffness.  Skin: Negative for rash.  Neurological: Negative for headaches.  Hematological: Does not bruise/bleed easily.  Psychiatric/Behavioral: Negative for confusion.    Physical Exam Updated Vital Signs BP 128/79 (BP Location: Right Arm)   Pulse (!) 103   Temp 97.9 F (36.6 C) (Oral)   Resp 18   Ht 1.6 m (5\' 3" )   Wt (!) 163.3 kg   LMP 07/16/2019   SpO2 100%   BMI 63.77 kg/m   Physical Exam Vitals and nursing note reviewed.  Constitutional:      Appearance: Normal appearance. She is well-developed.  HENT:     Head: Atraumatic.     Right Ear: Tympanic membrane normal.     Left Ear: Tympanic membrane normal.     Nose: Congestion and rhinorrhea present.     Mouth/Throat:     Mouth: Mucous membranes are moist.     Pharynx: Oropharynx is  clear. No oropharyngeal exudate or posterior oropharyngeal erythema.  Eyes:     General: No scleral icterus.    Conjunctiva/sclera: Conjunctivae normal.     Pupils: Pupils are equal, round, and reactive to light.  Neck:     Trachea: No tracheal deviation.  Cardiovascular:     Rate and Rhythm: Normal rate and regular rhythm.     Pulses: Normal pulses.     Heart sounds: Normal heart sounds. No murmur. No friction rub. No gallop.   Pulmonary:     Effort: Pulmonary effort is normal. No respiratory distress.     Breath sounds: Normal breath sounds.  Abdominal:     Palpations: Abdomen is soft.     Comments: No hsm.  Genitourinary:    Comments: No cva tenderness.  Musculoskeletal:        General: No swelling.     Cervical back: Normal range of motion and neck supple. No rigidity. No muscular tenderness.     Right lower leg: No edema.     Left lower leg: No edema.  Skin:    General: Skin is warm and dry.     Findings: No rash.  Neurological:     Mental Status: She is alert.     Comments: Alert, speech normal.   Psychiatric:        Mood and Affect: Mood normal.     ED Results / Procedures / Treatments   Labs (all labs ordered are listed, but only abnormal results are displayed) Labs Reviewed  RESPIRATORY PANEL BY RT PCR (FLU A&B, COVID)    EKG None  Radiology No results found.  Procedures Procedures (including critical care time)  Medications Ordered in ED Medications - No data to display  ED Course  I have reviewed the triage vital signs and the nursing notes.  Pertinent labs & imaging results that were available during my care of the patient were reviewed by me and considered in my medical decision making (see chart for details).    MDM Rules/Calculators/A&P                      Covid swab sent.  Reviewed nursing notes and prior charts for additional history.   Abigail Griffin was evaluated in Emergency Department on 12/07/2019 for the symptoms described in the  history of present illness. She was evaluated in the context of the global COVID-19 pandemic, which necessitated consideration that the patient might be at risk for infection with  the SARS-CoV-2 virus that causes COVID-19. Institutional protocols and algorithms that pertain to the evaluation of patients at risk for COVID-19 are in a state of rapid change based on information released by regulatory bodies including the CDC and federal and state organizations. These policies and algorithms were followed during the patient's care in the ED.  Symptoms and exam felt most c/w viral illness.  Pt appears well hydrated, not toxic appearing, no increased wob. Vitals normal.  Pt currently appears stable for d/c.   Return precautions provided.    Final Clinical Impression(s) / ED Diagnoses Final diagnoses:  None    Rx / DC Orders ED Discharge Orders    None         Cathren Laine, MD 12/07/19 2119

## 2019-12-07 NOTE — ED Triage Notes (Signed)
Pt complains of  Sore throat, congestion, cough- x2 days. Said she was around someone with bronchitis   20w preg - first pregnancy. 07/16/2019 LMP

## 2019-12-07 NOTE — Discharge Instructions (Addendum)
It was our pleasure to provide your ER care today - we hope that you feel better.  Your symptoms and exam are most consistent with a viral illness that should run its course at get better over the course of the next week - see information provided. (note that viral illnesses are not treated/not helped  with antibiotics).   We also did sent a covid and flu test the results of which will be back later tonight - you may check MyChart or call for results.   Follow up with your doctor in the coming week.  Return to ER if worse, new symptoms, trouble breathing, unable to swallow, or other concern.

## 2020-01-31 ENCOUNTER — Other Ambulatory Visit: Payer: Self-pay

## 2020-01-31 ENCOUNTER — Ambulatory Visit
Admission: EM | Admit: 2020-01-31 | Discharge: 2020-01-31 | Disposition: A | Discharge status: Home / Self Care | Payer: Medicaid Other | Attending: Emergency Medicine | Admitting: Emergency Medicine

## 2020-01-31 DIAGNOSIS — L509 Urticaria, unspecified: Secondary | ICD-10-CM

## 2020-01-31 DIAGNOSIS — R21 Rash and other nonspecific skin eruption: Secondary | ICD-10-CM

## 2020-01-31 MED ORDER — TRIAMCINOLONE ACETONIDE 0.1 % EX CREA: 1.0000 "application " | TOPICAL_CREAM | Freq: Two times a day (BID) | CUTANEOUS | 0 refills | Status: AC

## 2020-01-31 NOTE — ED Provider Notes (Signed)
[facility] CARE CENTER   [id number] 01/31/20 Arrival Time: 1846  CC: Rash  SUBJECTIVE:  Abigail Griffin is a 24 y.o. female, 51 pregnant, who presents with a rash to low back x 2 day.  Symptoms began after sitting in car. Localizes the rash to low back.  Describes it as itchy and burning.  Has tried OTC benadryl without relief.  Symptoms are made worse with scratching.  Reports similar symptoms in the past that improved with benadryl.  Denies fever, chills, nausea, vomiting, SOB, chest pain, abdominal pain, changes in bowel or bladder function.    ROS: As per HPI.  All other pertinent ROS negative.     Past Medical History:  Diagnosis Date  . Anxiety   . Heart murmur    as a baby  . Obesity   . Seizures (HCC)    as a child   Past Surgical History:  Procedure Laterality Date  . TONSILLECTOMY    . TONSILLECTOMY AND ADENOIDECTOMY Bilateral 02/10/2015   Procedure: TONSILLECTOMY ;  Surgeon: Vernie Murders, MD;  Location: [facility] ORS;  Service: ENT;  Laterality: Bilateral;   Allergies  Allergen Reactions  . Citalopram Hives and Rash  . Keflex [Cephalexin]     rash  . Amoxicillin Rash  . Other Rash    Body Wash   No current facility-administered medications on file prior to encounter.   Current Outpatient Medications on File Prior to Encounter  Medication Sig Dispense Refill  . albuterol (PROVENTIL HFA;VENTOLIN HFA) 108 (90 Base) MCG/ACT inhaler Inhale 2 puffs into the lungs every 6 (six) hours as needed for wheezing or shortness of breath. 1 Inhaler 2  . loratadine (CLARITIN) 10 MG tablet Take 1 tablet (10 mg total) by mouth daily as needed for allergies. One po daily x 5 days 30 tablet 0  . promethazine (PHENERGAN) 12.5 MG tablet Take 1 tablet (12.5 mg total) by mouth every 6 (six) hours as needed for nausea or vomiting. 12 tablet 0   Social History   Socioeconomic History  . Marital status: Single    Spouse name: Not on file  . Number of children: Not on file  . Years of  education: Not on file  . Highest education level: Not on file  Occupational History  . Not on file  Tobacco Use  . Smoking status: Passive Smoke Exposure - Never Smoker  . Smokeless tobacco: Never Used  Substance and Sexual Activity  . Alcohol use: No  . Drug use: No  . Sexual activity: Not Currently  Other Topics Concern  . Not on file  Social History Narrative  . Not on file   Social Determinants of Health   Financial Resource Strain:   . Difficulty of Paying Living Expenses:   Food Insecurity:   . Worried About Programme researcher, broadcasting/film/video in the Last Year:   . Barista in the Last Year:   Transportation Needs:   . Freight forwarder (Medical):   Marland Kitchen Lack of Transportation (Non-Medical):   Physical Activity:   . Days of Exercise per Week:   . Minutes of Exercise per Session:   Stress:   . Feeling of Stress :   Social Connections:   . Frequency of Communication with Friends and Family:   . Frequency of Social Gatherings with Friends and Family:   . Attends Religious Services:   . Active Member of Clubs or Organizations:   . Attends Banker Meetings:   Marland Kitchen Marital Status:  Intimate Partner Violence:   . Fear of Current or Ex-Partner:   . Emotionally Abused:   Marland Kitchen Physically Abused:   . Sexually Abused:    Family History  Problem Relation Age of Onset  . Crohn's disease Mother   . Diabetes Father     OBJECTIVE: Vitals:   01/31/20 1853  BP: 134/85  Pulse: (!) 106  Resp: 17  Temp: 98.4 F (36.9 C)  TempSrc: Oral  SpO2: 98%    General appearance: alert; no distress Head: NCAT Lungs: normal respiratory effort Extremities: no edema Skin: warm and dry; papular erythematous rash to lower back over bilateral buttock, NTTP, no discharge or bleeding Psychological: alert and cooperative; normal mood and affect  ASSESSMENT & PLAN:  1. Rash and nonspecific skin eruption   2. Hives     Meds ordered this encounter  Medications  . triamcinolone  cream (KENALOG) 0.1 %    Sig: Apply 1 application topically 2 (two) times daily.    Dispense:  30 g    Refill:  0    Order Specific Question:   Supervising Provider    Answer:   Raylene Everts [[id number]]    @[facility] @  Use antihistamine as needed for itching or calamine lotion Continue with benadryl Triamcinolone 0.1% cream prescribed.  Use as directed Follow up with OB tomorrow to see if a steroid would be appropriate for treatment Return or go to the ER if you have any new or worsening symptoms such as fever, chills, nausea, vomiting, redness, swelling, discharge, if symptoms do not improve with medications, etc...  Reviewed expectations re: course of current medical issues. Questions answered. Outlined signs and symptoms indicating need for more acute intervention. Patient verbalized understanding. After Visit Summary given.   Lestine Box, PA-C 01/31/20 1923

## 2020-01-31 NOTE — Discharge Instructions (Signed)
  Use antihistamine as needed for itching or calamine lotion Continue with benadryl Triamcinolone 0.1% cream prescribed.  Use as directed Follow up with OB tomorrow to see if a steroid would be appropriate for treatment Return or go to the ER if you have any new or worsening symptoms such as fever, chills, nausea, vomiting, redness, swelling, discharge, if symptoms do not improve with medications, etc..Marland Kitchen

## 2020-01-31 NOTE — ED Triage Notes (Signed)
Provider triage  

## 2020-05-06 ENCOUNTER — Encounter (HOSPITAL_COMMUNITY): Payer: Self-pay

## 2020-05-06 ENCOUNTER — Other Ambulatory Visit: Payer: Self-pay

## 2020-05-06 ENCOUNTER — Emergency Department (HOSPITAL_COMMUNITY)
Admission: EM | Admit: 2020-05-06 | Discharge: 2020-05-06 | Disposition: A | Discharge status: Home / Self Care | Payer: Medicaid Other | Attending: Emergency Medicine | Admitting: Emergency Medicine

## 2020-05-06 ENCOUNTER — Emergency Department (HOSPITAL_COMMUNITY): Payer: Medicaid Other

## 2020-05-06 DIAGNOSIS — S82832A Other fracture of upper and lower end of left fibula, initial encounter for closed fracture: Secondary | ICD-10-CM | POA: Insufficient documentation

## 2020-05-06 DIAGNOSIS — Z79899 Other long term (current) drug therapy: Secondary | ICD-10-CM | POA: Diagnosis not present

## 2020-05-06 DIAGNOSIS — S82839A Other fracture of upper and lower end of unspecified fibula, initial encounter for closed fracture: Secondary | ICD-10-CM

## 2020-05-06 DIAGNOSIS — Z7951 Long term (current) use of inhaled steroids: Secondary | ICD-10-CM | POA: Diagnosis not present

## 2020-05-06 DIAGNOSIS — Y9389 Activity, other specified: Secondary | ICD-10-CM | POA: Insufficient documentation

## 2020-05-06 DIAGNOSIS — M25572 Pain in left ankle and joints of left foot: Secondary | ICD-10-CM | POA: Diagnosis present

## 2020-05-06 DIAGNOSIS — Y998 Other external cause status: Secondary | ICD-10-CM | POA: Insufficient documentation

## 2020-05-06 DIAGNOSIS — S93492A Sprain of other ligament of left ankle, initial encounter: Secondary | ICD-10-CM | POA: Diagnosis not present

## 2020-05-06 DIAGNOSIS — X58XXXA Exposure to other specified factors, initial encounter: Secondary | ICD-10-CM | POA: Diagnosis not present

## 2020-05-06 DIAGNOSIS — Y9289 Other specified places as the place of occurrence of the external cause: Secondary | ICD-10-CM | POA: Diagnosis not present

## 2020-05-06 DIAGNOSIS — Z7722 Contact with and (suspected) exposure to environmental tobacco smoke (acute) (chronic): Secondary | ICD-10-CM | POA: Insufficient documentation

## 2020-05-06 MED ORDER — NAPROXEN 250 MG PO TABS
500.0000 mg | ORAL_TABLET | Freq: Once | ORAL | Status: AC
  Administered 2020-05-06: 500 mg via ORAL
  Filled 2020-05-06: qty 2

## 2020-05-06 NOTE — ED Provider Notes (Signed)
[facility] EMERGENCY DEPARTMENT Provider Note   CSN: [id number] Arrival date & time: 05/06/20  1357     History Chief Complaint  Patient presents with  . Ankle Pain    Abigail Griffin is a 24 y.o. female with no relevant past medical history presents the ED with complaints of left ankle pain.  Patient reports that she was stepping out of the vehicle when she landed awkwardly and inverted her left ankle.  Since then, she has been ambulatory, albeit with difficulty and pain.  She is complaining of pain on the outside of her left ankle.  No pain elsewhere.  She denies any history of similar injury.  She denies any numbness, weakness, wounds, bleeding disorder, or other injury.  Patient recently gave birth to her child last month.  She is not breast-feeding.  She is not currently pregnant.  Patient has not yet taken anything for her symptoms of pain which she describes as 7 out of 10.  HPI     Past Medical History:  Diagnosis Date  . Anxiety   . Heart murmur    as a baby  . Obesity   . Seizures (HCC)    as a child    Patient Active Problem List   Diagnosis Date Noted  . Abdominal pain in female patient 11/23/2016  . Leukocytosis 06/02/2016    Past Surgical History:  Procedure Laterality Date  . TONSILLECTOMY    . TONSILLECTOMY AND ADENOIDECTOMY Bilateral 02/10/2015   Procedure: TONSILLECTOMY ;  Surgeon: Vernie Murders, MD;  Location: [facility] ORS;  Service: ENT;  Laterality: Bilateral;     OB History    Gravida  1   Para      Term      Preterm      AB      Living        SAB      TAB      Ectopic      Multiple      Live Births              Family History  Problem Relation Age of Onset  . Crohn's disease Mother   . Diabetes Father     Social History   Tobacco Use  . Smoking status: Passive Smoke Exposure - Never Smoker  . Smokeless tobacco: Never Used  Substance Use Topics  . Alcohol use: No  . Drug use: No    Home Medications Prior to  Admission medications   Medication Sig Start Date End Date Taking? Authorizing Provider  albuterol (PROVENTIL HFA;VENTOLIN HFA) 108 (90 Base) MCG/ACT inhaler Inhale 2 puffs into the lungs every 6 (six) hours as needed for wheezing or shortness of breath. 09/07/16  Yes Willy Eddy, MD  ibuprofen (ADVIL) 800 MG tablet Take 800 mg by mouth every 8 (eight) hours as needed.   Yes [provider]  loratadine (CLARITIN) 10 MG tablet Take 1 tablet (10 mg total) by mouth daily as needed for allergies. One po daily x 5 days Patient not taking: Reported on 05/06/2020 09/08/19   Bethann Berkshire, MD  omeprazole (PRILOSEC) 20 MG capsule Take 20 mg by mouth daily. Patient not taking: Reported on 05/06/2020 04/04/20   [provider]  promethazine (PHENERGAN) 12.5 MG tablet Take 1 tablet (12.5 mg total) by mouth every 6 (six) hours as needed for nausea or vomiting. Patient not taking: Reported on 05/06/2020 09/07/16   Willy Eddy, MD  triamcinolone cream (KENALOG) 0.1 % Apply 1  application topically 2 (two) times daily. Patient not taking: Reported on 05/06/2020 01/31/20   Alvino Chapel [place], PA-C    Allergies    Citalopram, Keflex [cephalexin], Amoxicillin, and Other  Review of Systems   Review of Systems  Musculoskeletal: Positive for arthralgias, gait problem and joint swelling.  Skin: Negative for color change and wound.  Neurological: Negative for weakness and numbness.    Physical Exam Updated Vital Signs BP (!) 115/91 (BP Location: Right Arm)   Pulse 100   Temp 98.5 F (36.9 C) (Oral)   Resp 18   Ht 5\' 3"  (1.6 m)   Wt (!) 158.3 kg   LMP 04/21/2020   SpO2 100%   Breastfeeding Unknown   BMI 61.82 kg/m   Physical Exam Vitals and nursing note reviewed. Exam conducted with a chaperone present.  Constitutional:      Appearance: Normal appearance.  HENT:     Head: Normocephalic and atraumatic.  Eyes:     General: No scleral icterus.    Conjunctiva/sclera: Conjunctivae normal.    Cardiovascular:     Rate and Rhythm: Normal rate and regular rhythm.     Pulses: Normal pulses.     Heart sounds: Normal heart sounds.  Pulmonary:     Effort: Pulmonary effort is normal. No respiratory distress.     Breath sounds: Normal breath sounds.  Musculoskeletal:     Comments: Left ankle: Mild swelling over lateral malleolus.  Significant TTP over lateral malleolus, no TTP elsewhere.  No overlying skin changes.  Negative Thompson's test.  Compartments are soft.  No TTP along proximal aspect of fibula.  No tibial tenderness.  Capillary refill < 2 seconds.  Pedal pulse and sensation intact throughout.  Able to dorsiflex and plantar flex against resistance. Left knee: Normal.  No tenderness and strength/ROM intact. Left hip: Normal.  No tenderness and strength/ROM intact.  Skin:    General: Skin is dry.  Neurological:     Mental Status: She is alert.     GCS: GCS eye subscore is 4. GCS verbal subscore is 5. GCS motor subscore is 6.  Psychiatric:        Mood and Affect: Mood normal.        Behavior: Behavior normal.        Thought Content: Thought content normal.     ED Results / Procedures / Treatments   Labs (all labs ordered are listed, but only abnormal results are displayed) Labs Reviewed - No data to display  EKG None  Radiology DG Ankle Complete Left  Result Date: 05/06/2020 CLINICAL DATA:  Left ankle pain after twisting injury this morning. Pain and swelling. EXAM: LEFT ANKLE COMPLETE - 3+ VIEW COMPARISON:  None. FINDINGS: Tiny faint avulsion fracture from the distal fibular tip. No other fracture of the ankle. Normal alignment and ankle mortise. No ankle joint effusion. Soft tissue edema versus habitus. IMPRESSION: Tiny faint avulsion fracture from the distal fibular tip. Electronically Signed   By: 07/06/2020 M.D.   On: 05/06/2020 15:16    Procedures Procedures (including critical care time)  Medications Ordered in ED Medications  naproxen (NAPROSYN)  tablet 500 mg (has no administration in time range)    ED Course  I have reviewed the triage vital signs and the nursing notes.  Pertinent labs & imaging results that were available during my care of the patient were reviewed by me and considered in my medical decision making (see chart for details).    MDM Rules/Calculators/A&P  Patient's history physical exam is initially concerning for ATFL sprain subsequent to her inversion injury.  However, I personally reviewed plain films and there does appear to be a tiny, faint, avulsion fracture from the distal fibular tip.  I offered patient the option between a cam walker and ASO brace and given that she has a new baby she would prefer to have a cam walker.  We will also provide patient with crutches.  Weightbearing as tolerated.  We will give her naproxen here in the ED and recommending over-the-counter NSAIDs on outpatient basis.  She states that her baby is using formula, but if she decides to breast-feed I encouraged her to discontinue NSAIDs.    I informed patient that the plain films only evaluate for acute osseous abnormalities and do not assess for ligamentous or tendinous injury.  If her symptoms fail to improve despite rest and anti-inflammatory medication, she may benefit from orthopedic evaluation.  We will refer her to Dr. Romeo Apple.  I informed her that these type of injuries can certainly linger for 6 to 8 weeks.    Strict ED return precautions discussed.  Patient voices understanding and is agreeable to the plan.  SPLINT APPLICATION Date/Time: 5:32 PM Authorized by: Evelena Leyden Consent: Verbal consent obtained. Risks and benefits: risks, benefits and alternatives were discussed Consent given by: patient Splint applied by: orthopedic technician Location details: left ankle Splint type: CAM walker Supplies used: CAM walker Post-procedure: The splinted body part was neurovascularly unchanged following  the procedure. Patient tolerance: Patient tolerated the procedure well with no immediate complications.   Final Clinical Impression(s) / ED Diagnoses Final diagnoses:  Sprain of anterior talofibular ligament of left ankle, initial encounter  Avulsion fracture of distal end of fibula    Rx / DC Orders ED Discharge Orders    None       Lorelee New, PA-C 05/06/20 1732    Bethann Berkshire, MD 05/09/20 [phone]

## 2020-05-06 NOTE — ED Triage Notes (Signed)
Pt presents to ED with left ankle pain after twisting it this morning.

## 2020-05-06 NOTE — Discharge Instructions (Addendum)
Your injury is suggestive of anterior talofibular ligament sprain, common during inversion injuries.  There is also a very small, faint, avulsion fracture involving the distal end of the fibula.  This is not a surgical injury.  Please continue to wear your cam walker and use crutches, as needed.  Weightbearing as tolerated.  Your symptoms of pain will likely persist for at least 4 weeks and can last upwards of 8 to 12 weeks.  Your x-rays today only reveal bony abnormalities and do not assess for ligamentous or tendinous injury.  If your symptoms fail to improve or get worse, I recommend that you schedule an appointment with Dr. Romeo Apple, local orthopedist.  Please take over-the-counter NSAIDs as needed for symptoms of pain.  Discontinue should you begin breast-feeding or get pregnant.  Return to ER seek immediate medical attention should you experience any new or worsening symptoms.

## 2020-05-06 NOTE — ED Notes (Signed)
Ice pack to left foot

## 2020-05-21 ENCOUNTER — Encounter: Payer: Self-pay | Admitting: Orthopedic Surgery

## 2020-05-21 ENCOUNTER — Other Ambulatory Visit: Payer: Self-pay

## 2020-05-21 ENCOUNTER — Ambulatory Visit (INDEPENDENT_AMBULATORY_CARE_PROVIDER_SITE_OTHER): Payer: Medicaid Other | Admitting: Orthopedic Surgery

## 2020-05-21 VITALS — BP 102/89 | HR 109 | Ht 63.0 in | Wt 349.0 lb

## 2020-05-21 DIAGNOSIS — Z6841 Body Mass Index (BMI) 40.0 and over, adult: Secondary | ICD-10-CM | POA: Diagnosis not present

## 2020-05-21 DIAGNOSIS — S82839A Other fracture of upper and lower end of unspecified fibula, initial encounter for closed fracture: Secondary | ICD-10-CM

## 2020-05-21 DIAGNOSIS — S82832A Other fracture of upper and lower end of left fibula, initial encounter for closed fracture: Secondary | ICD-10-CM | POA: Diagnosis not present

## 2020-05-21 NOTE — Patient Instructions (Signed)
1. Continue to wear the boot on your left ankle for the next 2 weeks.  You can then start to transition into a regular shoe  2.  You can return to work in 2 weeks 3.  Please call if you have any issues 4. You can start the exercises outlined below immediately.    Instructions  1.  You have sustained an ankle sprain  2.  I encourage you to stay on your feet and gradually remove your walking boot.   3.  Below are some exercises that you can complete on your own to improve your symptoms.  4.  As an alternative, you can search for ankle sprain exercises online, and can see some demonstrations on YouTube  5.  If you are having difficulty with these exercises, we can also prescribe formal physical therapy  Ankle Exercises Ask your health care provider which exercises are safe for you. Do exercises exactly as told by your health care provider and adjust them as directed. It is normal to feel mild stretching, pulling, tightness, or mild discomfort as you do these exercises. Stop right away if you feel sudden pain or your pain gets worse. Do not begin these exercises until told by your health care provider.  Stretching and range-of-motion exercises These exercises warm up your muscles and joints and improve the movement and flexibility of your ankle. These exercises may also help to relieve pain.  Dorsiflexion/plantar flexion  1. Sit with your left knee straight or bent. Do not rest your foot on anything. 2. Flex your left ankle to tilt the top of your foot toward your shin. This is called dorsiflexion. 3. Hold this position for 5 seconds. 4. Point your toes downward to tilt the top of your foot away from your shin. This is called plantar flexion. 5. Hold this position for 5 seconds. Repeat 10 times. Complete this exercise 2-3 times a day.  As tolerated  Ankle alphabet  1. Sit with your left 2.  foot supported at your lower leg. ? Do not rest your foot on anything. ? Make sure your foot  has room to move freely. 3. Think of your left foot as a paintbrush: ? Move your foot to trace each letter of the alphabet in the air. Keep your hip and knee still while you trace the letters. Trace every letter from A to Z. ? Make the letters as large as you can without causing or increasing any discomfort.  Repeat 2-3 times. Complete this exercise 2-3 times a day.   Strengthening exercises These exercises build strength and endurance in your ankle. Endurance is the ability to use your muscles for a long time, even after they get tired. Dorsiflexors These are muscles that lift your foot up. 1. Secure a rubber exercise band or tube to an object, such as a table leg, that will stay still when the band is pulled. Secure the other end around your left foot. 2. Sit on the floor, facing the object with your left leg extended. The band or tube should be slightly tense when your foot is relaxed. 3. Slowly flex your left ankle and toes to bring your foot toward your shin. 4. Hold this position for 5 seconds. 5. Slowly return your foot to the starting position, controlling the band as you do that. Repeat 10 times. Complete this exercise 2-3 times a day.  Plantar flexors These are muscles that push your foot down. 1. Sit on the floor with your left leg  extended. 2. Loop a rubber exercise band or tube around the ball of your left foot. The ball of your foot is on the walking surface, right under your toes. The band or tube should be slightly tense when your foot is relaxed. 3. Slowly point your toes downward, pushing them away from you. 4. Hold this position for 5 seconds. 5. Slowly release the tension in the band or tube, controlling smoothly until your foot is back in the starting position. Repeat 10 times. Complete this exercise 2-3 times a day.  Towel curls  1. Sit in a chair on a non-carpeted surface, and put your feet on the floor. 2. Place a towel in front of your feet. 3. Keeping your  heel on the floor, put your left foot on the towel. 4. Pull the towel toward you by grabbing the towel with your toes and curling them under. Keep your heel on the floor. 5. Let your toes relax. 6. Grab the towel again. Keep pulling the towel until it is completely underneath your foot. Repeat 10 times. Complete this exercise 2-3 times a day.  Standing plantar flexion This is an exercise in which you use your toes to lift your body's weight while standing. 1. Stand with your feet shoulder-width apart. 2. Keep your weight spread evenly over the width of your feet while you rise up on your toes. Use a wall or table to steady yourself if needed, but try not to use it for support. 3. If this exercise is too easy, try these options: ? Shift your weight toward your left leg until you feel challenged. ? If told by your health care provider, lift your uninjured leg off the floor. 4. Hold this position for 5 seconds. Repeat 10 times. Complete this exercise 2-3 times a day.  Tandem walking 1. Stand with one foot directly in front of the other. 2. Slowly raise your back foot up, lifting your heel before your toes, and place it directly in front of your other foot. 3. Continue to walk in this heel-to-toe way. Have a countertop or wall nearby to use if needed to keep your balance, but try not to hold onto anything for support.  Repeat 10 times. Complete this exercise 2-3 times a day.   Document Revised: 05/13/2018 Document Reviewed: 05/15/2018 Elsevier Patient Education  2020 [employer].

## 2020-05-21 NOTE — Progress Notes (Signed)
New Patient Visit  Assessment: Abigail Griffin is a 25 y.o. female with a small avulsion fracture at the distal fibula  Plan: I had extensive discussion with Abigail Griffin in clinic today in regards to her left ankle.  We reviewed the x-rays which demonstrates a small distal avulsion fracture at the tip of the left fibula.  She has been in a cam walker boot for the last 10 days, and has been improving.  Given the nature of the injury, we can essentially treat this as a sprained ankle.  I have encouraged her to initiate some exercises which I provided her with today.  Over the next couple weeks she can gradually transition of the walking boot, and into a regular shoe.  She states she is already started doing this.  In addition, she can attempt to find work in approximately 2 weeks, as her symptoms should be sufficiently improved by then.  If she has any issues in the future, encouraged her to contact the clinic.  Otherwise should be follow-up as needed.  The patient meets the AMA guidelines for Morbid obesity with BMI > 40.  The patient has been counseled on weight loss.    Follow-up: As needed  Subjective:  Chief Complaint  Patient presents with  . Foot Pain    twisted her foot  at baby doctor appointment     History of Present Illness: Abigail Griffin is a 24 y.o. female who presents for evaluation of her left ankle.  She is with her newborn daughter, at her doctor's appointment approximately 10 days ago, when she twisted her ankle.  She was evaluated shortly thereafter, placed in a walking boot.  Since then, the swelling, bruising and pain has improved.  However, she does note some throbbing, particularly at the end of the day.  She has been taking medications as needed.  Review of Systems: No fevers or chills No chest pain No shortness of breath   Medical History:  Past Medical History:  Diagnosis Date  . Anxiety   . Heart murmur    as a baby  . Obesity   . Seizures (HCC)    as a  child    Past Surgical History:  Procedure Laterality Date  . TONSILLECTOMY    . TONSILLECTOMY AND ADENOIDECTOMY Bilateral 02/10/2015   Procedure: TONSILLECTOMY ;  Surgeon: Vernie Murders, MD;  Location: [facility] ORS;  Service: ENT;  Laterality: Bilateral;    Family History  Problem Relation Age of Onset  . Crohn's disease Mother   . Diabetes Father    Social History   Tobacco Use  . Smoking status: Passive Smoke Exposure - Never Smoker  . Smokeless tobacco: Never Used  Substance Use Topics  . Alcohol use: No  . Drug use: No    Allergies  Allergen Reactions  . Citalopram Hives and Rash  . Keflex [Cephalexin]     rash  . Amoxicillin Rash  . Other Rash    Body Wash    Current Meds  Medication Sig  . albuterol (PROVENTIL HFA;VENTOLIN HFA) 108 (90 Base) MCG/ACT inhaler Inhale 2 puffs into the lungs every 6 (six) hours as needed for wheezing or shortness of breath.  Marland Kitchen ibuprofen (ADVIL) 800 MG tablet Take 800 mg by mouth every 8 (eight) hours as needed.  . loratadine (CLARITIN) 10 MG tablet Take 1 tablet (10 mg total) by mouth daily as needed for allergies. One po daily x 5 days  . omeprazole (PRILOSEC) 20 MG capsule  Take 20 mg by mouth daily.   Marland Kitchen triamcinolone cream (KENALOG) 0.1 % Apply 1 application topically 2 (two) times daily.    Objective: BP 102/89   Pulse (!) 109   Ht 5\' 3"  (1.6 m)   LMP 04/21/2020   BMI 61.82 kg/m   Physical Exam: Alert and oriented, no acute distress No increased work of breathing on room air  She walks with a left sided antalgic gait in her cam walker boot.  She can bear weight on the left ankle.  She continues to have some swelling, particularly over the lateral aspect of her ankle, extending distally.  Minimal ecchymosis is appreciated.  She does have some discomfort with inversion of her ankle.  Otherwise, strength is 5/5 throughout.  Sensation is intact distally.  She continues to be mildly tender to palpation over the distal  fibula.    IMAGING: I personally ordered and reviewed the following images:  X-rays of the left ankle from her previous visit were reviewed in clinic today and demonstrates a small distal avulsion fracture at the tip of the left fibula.  The overall mortise is intact.  Good overall alignment.  No orders of the defined types were placed in this encounter.     04/23/2020, MD  05/21/2020 11:39 AM

## 2020-05-31 ENCOUNTER — Encounter (HOSPITAL_COMMUNITY): Payer: Self-pay | Admitting: *Deleted

## 2020-05-31 ENCOUNTER — Other Ambulatory Visit: Payer: Self-pay

## 2020-05-31 ENCOUNTER — Emergency Department (HOSPITAL_COMMUNITY)
Admission: EM | Admit: 2020-05-31 | Discharge: 2020-05-31 | Disposition: A | Discharge status: Home / Self Care | Payer: Medicaid Other | Attending: Emergency Medicine | Admitting: Emergency Medicine

## 2020-05-31 DIAGNOSIS — R112 Nausea with vomiting, unspecified: Secondary | ICD-10-CM | POA: Insufficient documentation

## 2020-05-31 DIAGNOSIS — R109 Unspecified abdominal pain: Secondary | ICD-10-CM | POA: Insufficient documentation

## 2020-05-31 DIAGNOSIS — Z5321 Procedure and treatment not carried out due to patient leaving prior to being seen by health care provider: Secondary | ICD-10-CM | POA: Insufficient documentation

## 2020-05-31 DIAGNOSIS — M549 Dorsalgia, unspecified: Secondary | ICD-10-CM | POA: Diagnosis not present

## 2020-05-31 NOTE — ED Notes (Signed)
Called x 2 no answer

## 2020-05-31 NOTE — ED Triage Notes (Signed)
Pt with right flank pain for months, mid right back that radiates around to the front. Emesis x 6 today. + nausea.   Has taken 5 regular tylenol today.

## 2020-07-15 ENCOUNTER — Ambulatory Visit
Admission: EM | Admit: 2020-07-15 | Discharge: 2020-07-15 | Disposition: A | Discharge status: Home / Self Care | Payer: Medicaid Other

## 2020-07-15 DIAGNOSIS — R109 Unspecified abdominal pain: Secondary | ICD-10-CM | POA: Diagnosis not present

## 2020-07-15 DIAGNOSIS — R0981 Nasal congestion: Secondary | ICD-10-CM

## 2020-07-15 DIAGNOSIS — Z3202 Encounter for pregnancy test, result negative: Secondary | ICD-10-CM

## 2020-07-15 LAB — POCT URINE PREGNANCY: Preg Test, Ur: NEGATIVE

## 2020-07-15 MED ORDER — DEXAMETHASONE 4 MG PO TABS: 4.0000 mg | ORAL_TABLET | Freq: Every day | ORAL | 0 refills | Status: AC

## 2020-07-15 MED ORDER — FLUTICASONE PROPIONATE 50 MCG/ACT NA SUSP: 1.0000 | Freq: Every day | NASAL | 0 refills | Status: AC

## 2020-07-15 MED ORDER — CETIRIZINE HCL 10 MG PO TABS: 10.0000 mg | ORAL_TABLET | Freq: Every day | ORAL | 0 refills | Status: AC

## 2020-07-15 NOTE — ED Triage Notes (Signed)
2 weeks. Pt states that she has some nasal congestion. Pt states that the mucus is dark yellow and thick. Pt states that she is not vaccinated. Pt states that she also has some abdominal pain as well.

## 2020-07-15 NOTE — Discharge Instructions (Addendum)
   In the meantime: You should remain isolated in your home for 10 days from symptom onset AND greater than 24 hours after symptoms resolution (absence of fever without the use of fever-reducing medication and improvement in respiratory symptoms), whichever is longer Get plenty of rest and push fluids Zyrtec for nasal congestion, runny nose, and/or sore throat Flonase for nasal congestion and runny nose Decadron was prescribed Continue  Mucinex as directed Use medications daily for symptom relief Use OTC medications like ibuprofen or tylenol as needed fever or pain Call or go to the ED if you have any new or worsening symptoms such as fever, worsening cough, shortness of breath, chest tightness, chest pain, turning blue, changes in mental status, etc..Marland Kitchen

## 2020-07-15 NOTE — ED Provider Notes (Addendum)
[facility]   [id number] 07/15/20 Arrival Time: 1127   CC: COVID symptoms  SUBJECTIVE: History from: patient.  Abigail Griffin is a 24 y.o. female who presents the urgent care with a complaint of nasal congestion and abdominal pain for the past 1 week.  Reports she has tested negative for Covid 1 week ago.  Denies sick exposure to COVID, flu or strep.  Denies recent travel.  Has tried DC medication without relief  relief.  Denies aggravating factors.  Denies previous symptoms in the past.   Denies fever, chills, fatigue, sinus pain, rhinorrhea, sore throat, SOB, wheezing, chest pain, nausea, changes in bowel or bladder habits.    She also like to have a pregnancy test completed this visit  ROS: As per HPI.  All other pertinent ROS negative.     Past Medical History:  Diagnosis Date  . Anxiety   . Heart murmur    as a baby  . Obesity   . Seizures (HCC)    as a child   Past Surgical History:  Procedure Laterality Date  . TONSILLECTOMY    . TONSILLECTOMY AND ADENOIDECTOMY Bilateral 02/10/2015   Procedure: TONSILLECTOMY ;  Surgeon: Vernie Murders, MD;  Location: [facility] ORS;  Service: ENT;  Laterality: Bilateral;   Allergies  Allergen Reactions  . Citalopram Hives and Rash  . Keflex [Cephalexin]     rash  . Amoxicillin Rash  . Penicillins Rash   No current facility-administered medications on file prior to encounter.   Current Outpatient Medications on File Prior to Encounter  Medication Sig Dispense Refill  . Acetaminophen (TYLENOL 8 HOUR PO) Take by mouth.    Marland Kitchen albuterol (PROVENTIL HFA;VENTOLIN HFA) 108 (90 Base) MCG/ACT inhaler Inhale 2 puffs into the lungs every 6 (six) hours as needed for wheezing or shortness of breath. 1 Inhaler 2  . ibuprofen (ADVIL) 800 MG tablet Take 800 mg by mouth every 8 (eight) hours as needed.    . loratadine (CLARITIN) 10 MG tablet Take 1 tablet (10 mg total) by mouth daily as needed for allergies. One po daily x 5 days 30 tablet 0  .  omeprazole (PRILOSEC) 20 MG capsule Take 20 mg by mouth daily.     Marland Kitchen triamcinolone cream (KENALOG) 0.1 % Apply 1 application topically 2 (two) times daily. 30 g 0   Social History   Socioeconomic History  . Marital status: Single    Spouse name: Not on file  . Number of children: Not on file  . Years of education: Not on file  . Highest education level: Not on file  Occupational History  . Not on file  Tobacco Use  . Smoking status: Current Every Day Smoker    Types: Cigarettes  . Smokeless tobacco: Never Used  Substance and Sexual Activity  . Alcohol use: No  . Drug use: No  . Sexual activity: Not Currently  Other Topics Concern  . Not on file  Social History Narrative  . Not on file   Social Determinants of Health   Financial Resource Strain:   . Difficulty of Paying Living Expenses: Not on file  Food Insecurity:   . Worried About Programme researcher, broadcasting/film/video in the Last Year: Not on file  . Ran Out of Food in the Last Year: Not on file  Transportation Needs:   . Lack of Transportation (Medical): Not on file  . Lack of Transportation (Non-Medical): Not on file  Physical Activity:   . Days of Exercise  per Week: Not on file  . Minutes of Exercise per Session: Not on file  Stress:   . Feeling of Stress : Not on file  Social Connections:   . Frequency of Communication with Friends and Family: Not on file  . Frequency of Social Gatherings with Friends and Family: Not on file  . Attends Religious Services: Not on file  . Active Member of Clubs or Organizations: Not on file  . Attends Banker Meetings: Not on file  . Marital Status: Not on file  Intimate Partner Violence:   . Fear of Current or Ex-Partner: Not on file  . Emotionally Abused: Not on file  . Physically Abused: Not on file  . Sexually Abused: Not on file   Family History  Problem Relation Age of Onset  . Crohn's disease Mother   . Diabetes Father     OBJECTIVE:  Vitals:   07/15/20 1242  07/15/20 1245  BP:  138/82  Pulse:  92  Temp:  98.3 F (36.8 C)  TempSrc:  Oral  SpO2:  98%  Weight: (!) 339 lb (153.8 kg)   Height: 5\' 3"  (1.6 m)      Physical Exam Vitals and nursing note reviewed.  Constitutional:      General: She is not in acute distress.    Appearance: Normal appearance. She is normal weight. She is not ill-appearing, toxic-appearing or diaphoretic.  HENT:     Head: Normocephalic.     Right Ear: Tympanic membrane, ear canal and external ear normal. There is no impacted cerumen.     Left Ear: Tympanic membrane and external ear normal. There is no impacted cerumen.     Nose: Congestion present.  Cardiovascular:     Rate and Rhythm: Normal rate and regular rhythm.     Pulses: Normal pulses.     Heart sounds: Normal heart sounds. No murmur heard.  No friction rub. No gallop.   Pulmonary:     Effort: Pulmonary effort is normal. No respiratory distress.     Breath sounds: Normal breath sounds. No stridor. No wheezing, rhonchi or rales.  Chest:     Chest wall: No tenderness.  Abdominal:     General: Abdomen is flat. There is no distension.     Palpations: There is no mass.     Tenderness: There is no abdominal tenderness. There is no right CVA tenderness, left CVA tenderness, guarding or rebound.     Hernia: No hernia is present.  Neurological:     Mental Status: She is alert and oriented to person, place, and time.     LABS:  Results for orders placed or performed during the hospital encounter of 07/15/20 (from the past 24 hour(s))  POCT urine pregnancy     Status: None   Collection Time: 07/15/20 12:53 PM  Result Value Ref Range   Preg Test, Ur Negative Negative     ASSESSMENT & PLAN:  1. Abdominal pain in female patient   2. Nasal congestion   3. Urine pregnancy test negative     No orders of the defined types were placed in this encounter.   Discharge instructions.07/17/20    COVID testing ordered.  It will take between 2-7 days for test  results.  Someone will contact you regarding abnormal results.    In the meantime: You should remain isolated in your home for 10 days from symptom onset AND greater than 24 hours after symptoms resolution (absence of fever without the use  of fever-reducing medication and improvement in respiratory symptoms), whichever is longer Get plenty of rest and push fluids Zyrtec for nasal congestion, runny nose, and/or sore throat Flonase for nasal congestion and runny nose Decadron was prescribed Continue  Mucinex as directed Use medications daily for symptom relief Use OTC medications like ibuprofen or tylenol as needed fever or pain Call or go to the ED if you have any new or worsening symptoms such as fever, worsening cough, shortness of breath, chest tightness, chest pain, turning blue, changes in mental status, etc...   Reviewed expectations re: course of current medical issues. Questions answered. Outlined signs and symptoms indicating need for more acute intervention. Patient verbalized understanding. After Visit Summary given.         Durward Parcel, FNP 07/15/20 1336    Durward Parcel, FNP 07/15/20 1337

## 2020-11-03 ENCOUNTER — Other Ambulatory Visit: Payer: Self-pay | Admitting: Orthopedic Surgery

## 2020-11-03 DIAGNOSIS — S83249A Other tear of medial meniscus, current injury, unspecified knee, initial encounter: Secondary | ICD-10-CM

## 2020-11-22 ENCOUNTER — Ambulatory Visit
Admission: RE | Admit: 2020-11-22 | Discharge: 2020-11-22 | Disposition: A | Discharge status: Home / Self Care | Payer: Medicaid Other | Source: Ambulatory Visit | Attending: Orthopedic Surgery | Admitting: Orthopedic Surgery

## 2020-11-22 ENCOUNTER — Other Ambulatory Visit: Payer: Self-pay

## 2020-11-22 DIAGNOSIS — S83249A Other tear of medial meniscus, current injury, unspecified knee, initial encounter: Secondary | ICD-10-CM

## 2020-12-03 ENCOUNTER — Encounter (INDEPENDENT_AMBULATORY_CARE_PROVIDER_SITE_OTHER): Payer: Self-pay | Admitting: *Deleted

## 2021-01-30 ENCOUNTER — Encounter: Payer: Medicaid Other | Attending: Physician Assistant | Admitting: Dietician

## 2021-03-30 ENCOUNTER — Ambulatory Visit (INDEPENDENT_AMBULATORY_CARE_PROVIDER_SITE_OTHER): Payer: Medicaid Other | Admitting: Gastroenterology

## 2021-03-31 ENCOUNTER — Encounter (INDEPENDENT_AMBULATORY_CARE_PROVIDER_SITE_OTHER): Payer: Self-pay | Admitting: *Deleted

## 2022-10-28 ENCOUNTER — Encounter: Payer: Self-pay | Admitting: Radiology
